# Patient Record
Sex: Male | Born: 1940 | Race: White | Hispanic: No | Marital: Married | Smoking: Never smoker
Health system: Southern US, Community
[De-identification: ages and names within clinical notes are randomized; demographics above are authoritative.]

## PROBLEM LIST (undated history)

## (undated) DIAGNOSIS — E785 Hyperlipidemia, unspecified: Secondary | ICD-10-CM

## (undated) DIAGNOSIS — R569 Unspecified convulsions: Secondary | ICD-10-CM

## (undated) DIAGNOSIS — W3400XA Accidental discharge from unspecified firearms or gun, initial encounter: Secondary | ICD-10-CM

## (undated) DIAGNOSIS — I10 Essential (primary) hypertension: Secondary | ICD-10-CM

## (undated) HISTORY — PX: COLONOSCOPY: SHX174

---

## 2006-02-06 HISTORY — PX: TOTAL HIP ARTHROPLASTY: SHX124

## 2015-02-07 HISTORY — PX: BACK SURGERY: SHX140

## 2017-05-03 ENCOUNTER — Emergency Department (HOSPITAL_COMMUNITY): Payer: Medicare Other

## 2017-05-03 ENCOUNTER — Emergency Department (HOSPITAL_COMMUNITY)
Admission: EM | Admit: 2017-05-03 | Discharge: 2017-05-03 | Disposition: A | Discharge status: Home / Self Care | Payer: Medicare Other | Attending: Emergency Medicine | Admitting: Emergency Medicine

## 2017-05-03 ENCOUNTER — Encounter (HOSPITAL_COMMUNITY): Payer: Self-pay | Admitting: Emergency Medicine

## 2017-05-03 DIAGNOSIS — Z7982 Long term (current) use of aspirin: Secondary | ICD-10-CM | POA: Diagnosis not present

## 2017-05-03 DIAGNOSIS — W182XXA Fall in (into) shower or empty bathtub, initial encounter: Secondary | ICD-10-CM | POA: Diagnosis not present

## 2017-05-03 DIAGNOSIS — Y9389 Activity, other specified: Secondary | ICD-10-CM | POA: Insufficient documentation

## 2017-05-03 DIAGNOSIS — Z79899 Other long term (current) drug therapy: Secondary | ICD-10-CM | POA: Diagnosis not present

## 2017-05-03 DIAGNOSIS — I10 Essential (primary) hypertension: Secondary | ICD-10-CM | POA: Diagnosis not present

## 2017-05-03 DIAGNOSIS — S4992XA Unspecified injury of left shoulder and upper arm, initial encounter: Secondary | ICD-10-CM | POA: Diagnosis present

## 2017-05-03 DIAGNOSIS — Y92002 Bathroom of unspecified non-institutional (private) residence single-family (private) house as the place of occurrence of the external cause: Secondary | ICD-10-CM | POA: Diagnosis not present

## 2017-05-03 DIAGNOSIS — Z96641 Presence of right artificial hip joint: Secondary | ICD-10-CM | POA: Insufficient documentation

## 2017-05-03 DIAGNOSIS — S42402A Unspecified fracture of lower end of left humerus, initial encounter for closed fracture: Secondary | ICD-10-CM | POA: Diagnosis not present

## 2017-05-03 DIAGNOSIS — Y999 Unspecified external cause status: Secondary | ICD-10-CM | POA: Insufficient documentation

## 2017-05-03 HISTORY — DX: Hyperlipidemia, unspecified: E78.5

## 2017-05-03 HISTORY — DX: Essential (primary) hypertension: I10

## 2017-05-03 HISTORY — DX: Unspecified convulsions: R56.9

## 2017-05-03 IMAGING — CR DG ELBOW COMPLETE 3+V*L*
4 series · 4 of 4 positions shown · non-contrast
Comparison: None.

CLINICAL DATA: Pain following fall

EXAM:
LEFT ELBOW - COMPLETE 3+ VIEW

[elbow ap]
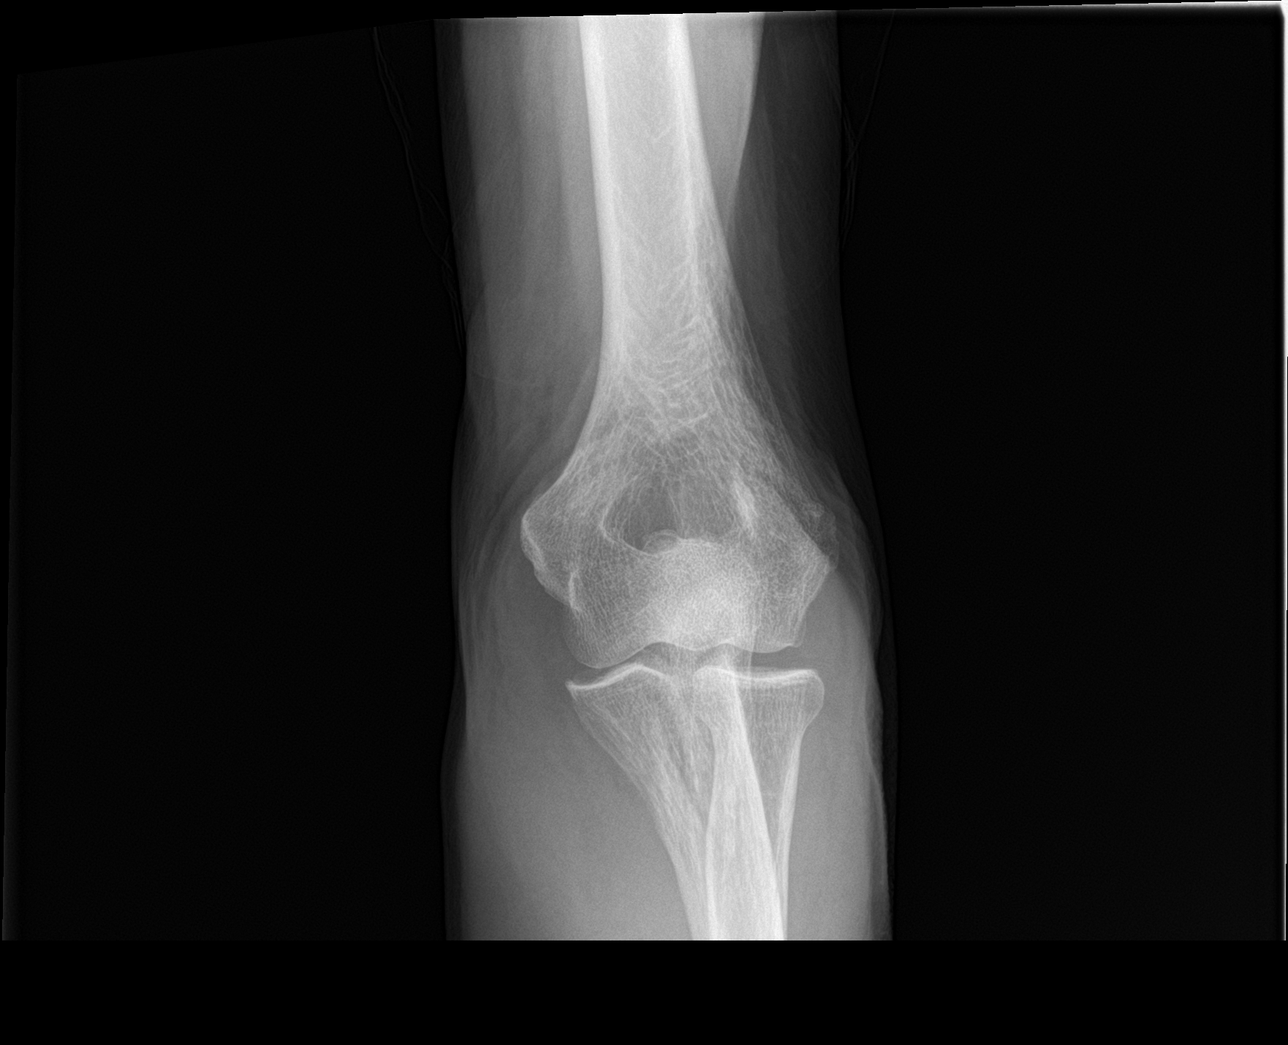

[elbow obl (1 of 2)]
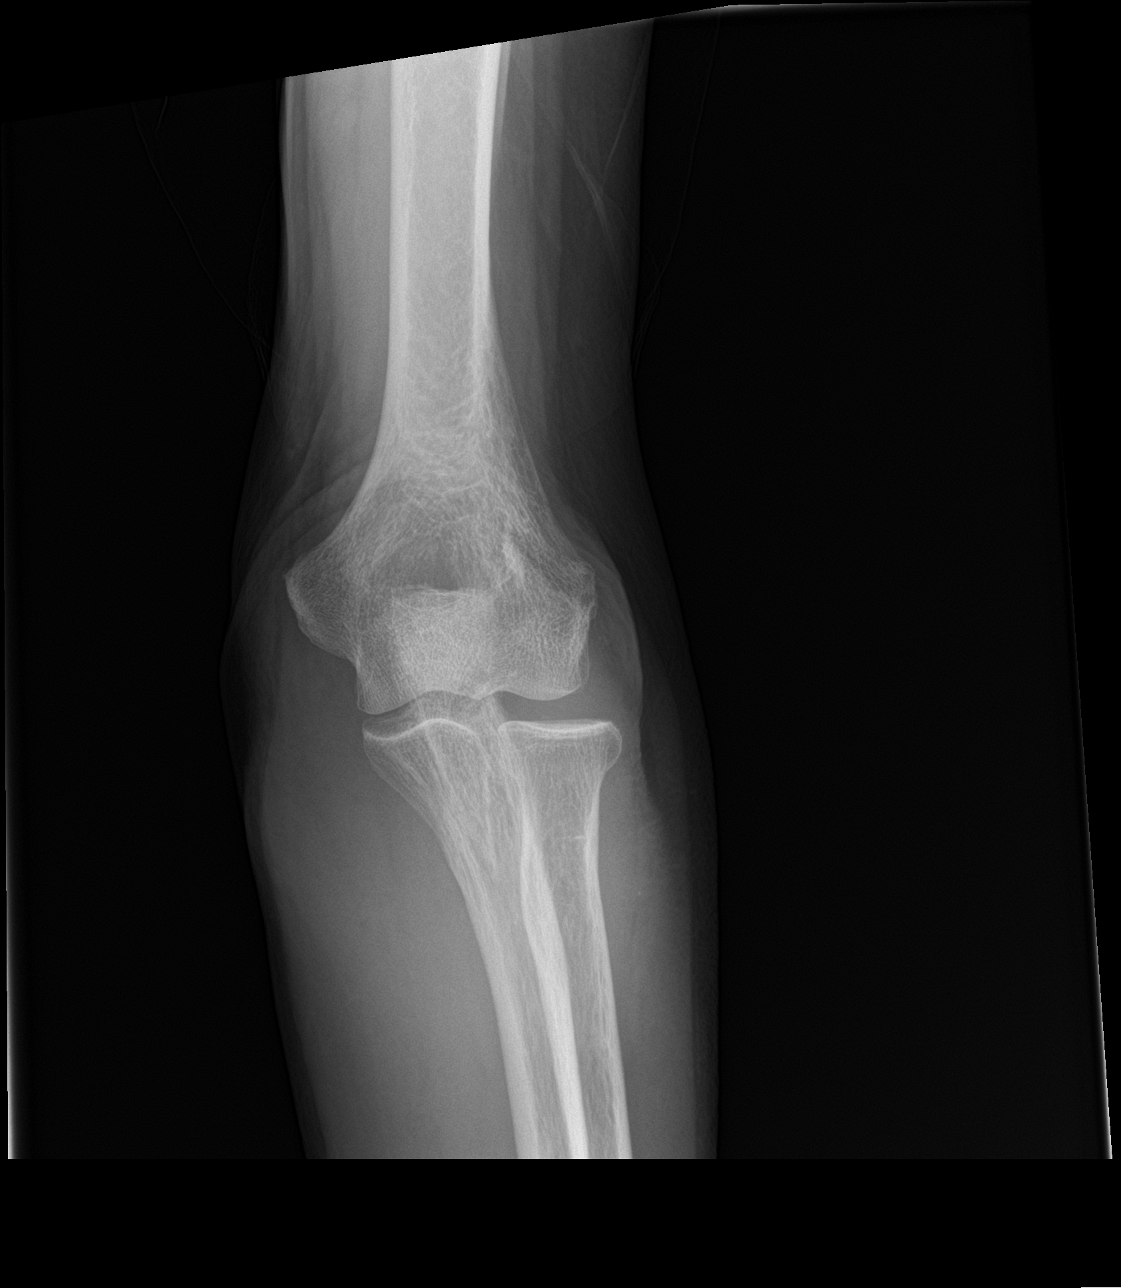

[elbow obl (2 of 2)]
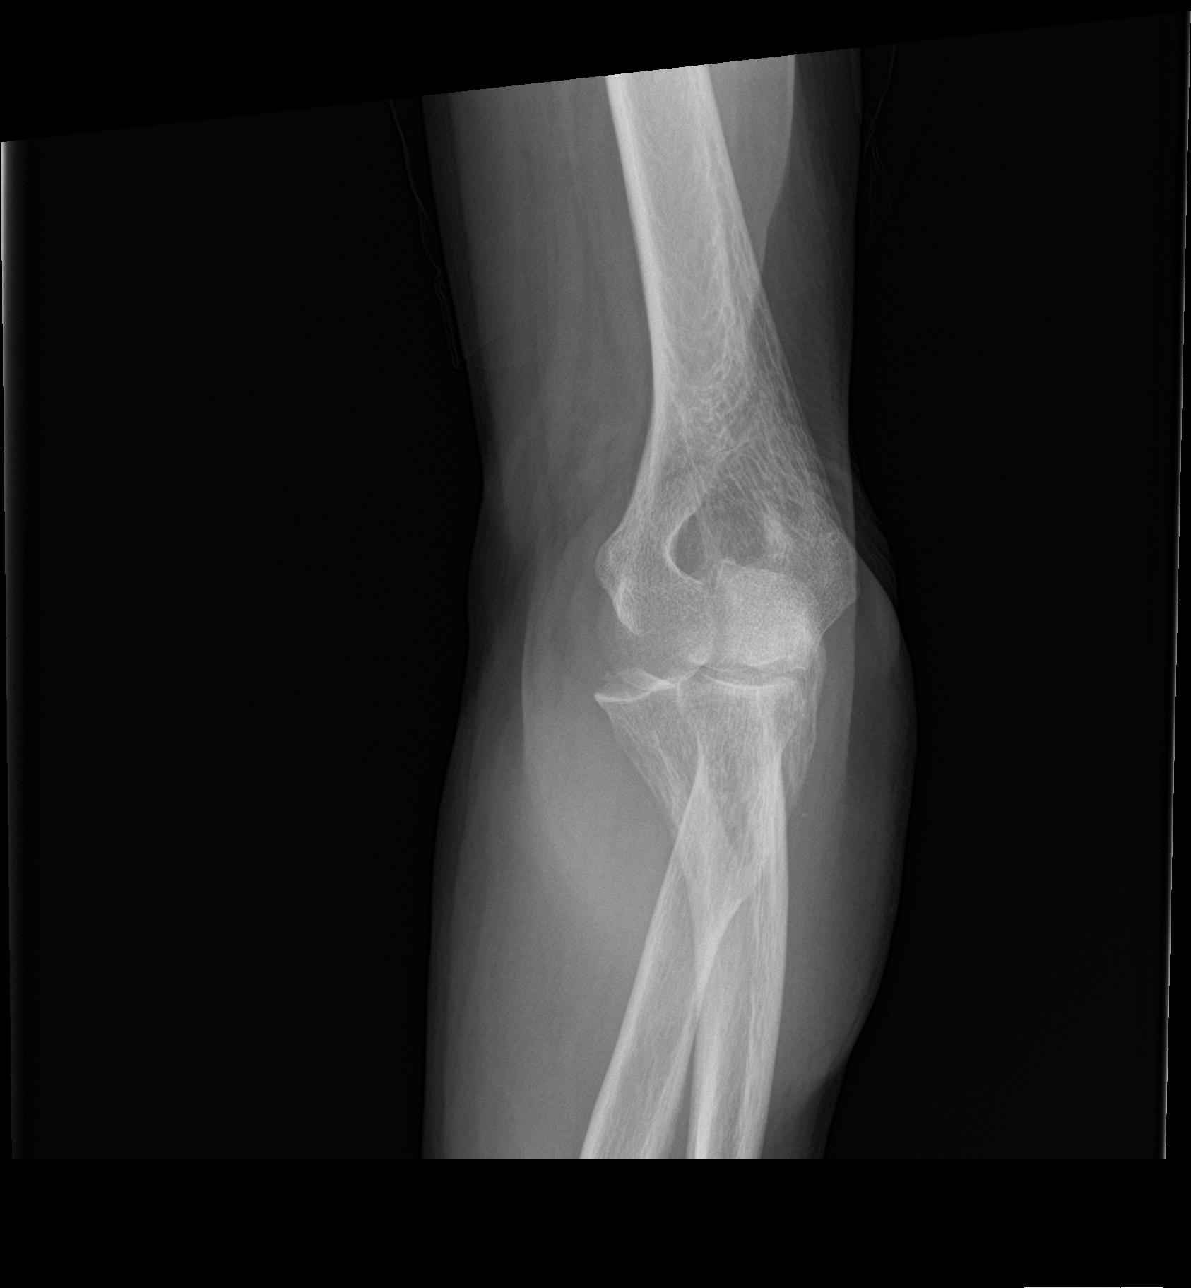

[elbow lat]
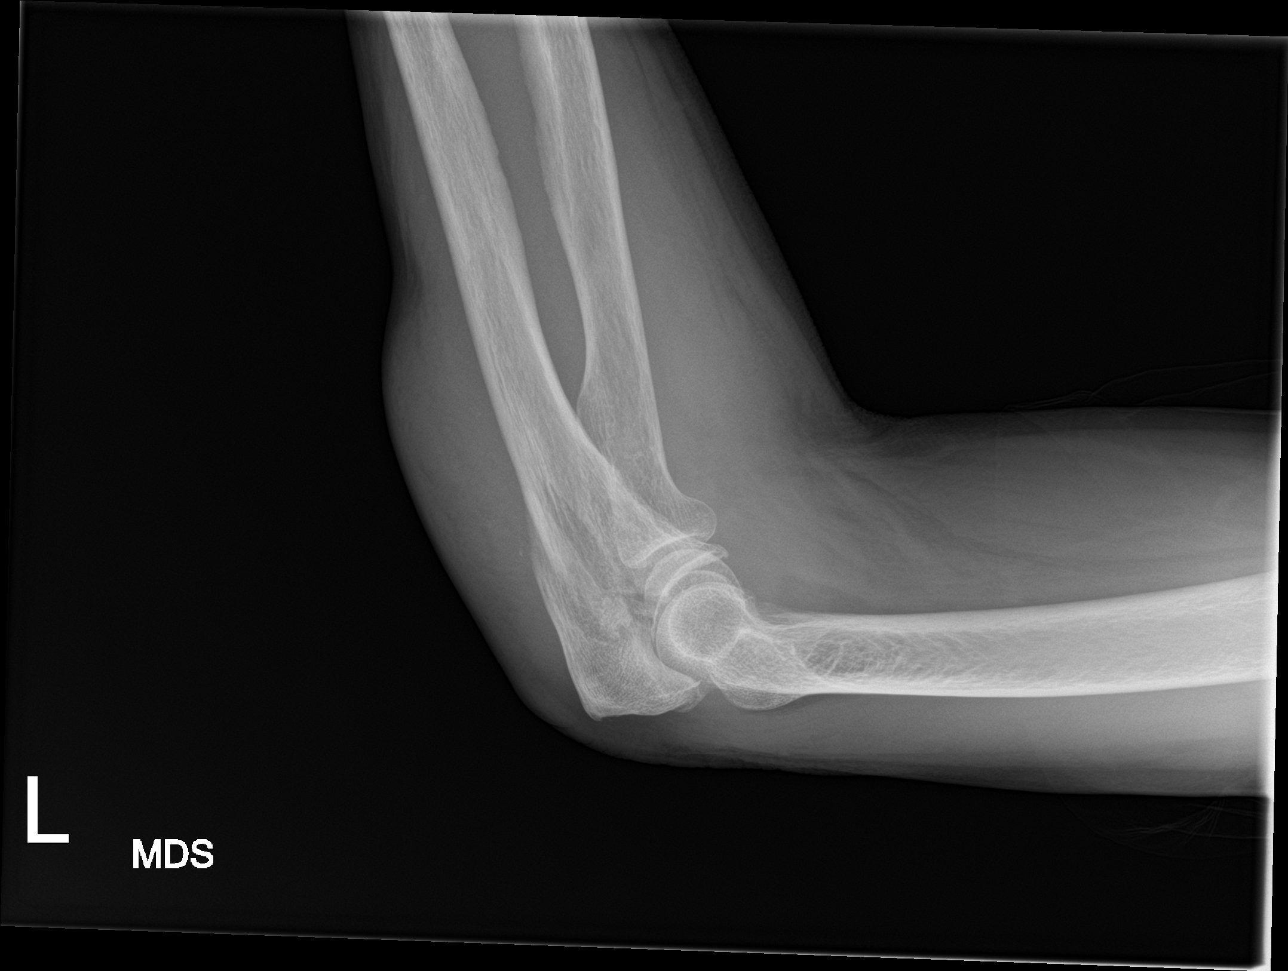

[4 of 4 positions shown; findings below may reference images not displayed]

FINDINGS: Frontal, lateral, and bilateral oblique views were obtained. There
is a comminuted fracture of the proximal ulna with marked soft
tissue swelling in this area. There is displacement of several
fracture fragments, best appreciated on the oblique views. There
fracture fragments extending into the anterior aspect of the elbow
joint. No dislocation. There is a joint effusion. There is no
significant joint space narrowing.
IMPRESSION: Comminuted fracture of the proximal ulna with marked soft tissue
swelling. Fracture fragments extend into the anterior aspect of the
right elbow joint. There is rotation of several fracture fragments
within this rather complex comminuted fracture region. No gross
dislocation. No underlying arthropathy.

## 2017-05-03 MED ORDER — HYDROCODONE-ACETAMINOPHEN 5-325 MG PO TABS: 1.0000 | ORAL_TABLET | Freq: Four times a day (QID) | ORAL | 0 refills | Status: DC | PRN

## 2017-05-03 NOTE — ED Provider Notes (Signed)
[facility] EMERGENCY DEPARTMENT Provider Note   CSN: [id number] Arrival date & time: 05/03/17  1610     History   Chief Complaint Chief Complaint  Patient presents with  . Broken Arm    HPI Aaron [name] is a 77 y.o. male.  The history is provided by the patient.  Arm Injury   This is a new problem. The current episode started 3 to 5 hours ago. The problem occurs constantly. The pain is present in the left elbow. The quality of the pain is described as aching. The pain is at a severity of 4/10. Associated symptoms include limited range of motion. Pertinent negatives include no numbness and no tingling. There has been a history of trauma.  Fall  This is a new problem. The current episode started 3 to 5 hours ago. Pertinent negatives include no chest pain, no abdominal pain, no headaches and no shortness of breath.  -Patient had a mechanical fall getting out of the shower and fell onto his left arm with sudden onset of pain.  Patient had x-rays done at urgent care that showed ulnar fracture near the elbow.  Patient is in here for further evaluation.  Past Medical History:  Diagnosis Date  . HLD (hyperlipidemia)   . Hypertension   . Seizures ([facility])     There are no active problems to display for this patient.   Past Surgical History:  Procedure Laterality Date  . TOTAL HIP ARTHROPLASTY Right 2008        Home Medications    Prior to Admission medications   Medication Sig Start Date End Date Taking? Authorizing Provider  Artificial Tear Ointment (DRY EYES OP) Place 1 drop into both eyes at bedtime.   Yes [provider]  aspirin EC 81 MG tablet Take 81 mg by mouth daily. 02/15/17 02/15/18 Yes [provider]  atorvastatin (LIPITOR) 80 MG tablet Take 80 mg by mouth daily. 02/15/17  Yes [provider]  latanoprost (XALATAN) 0.005 % ophthalmic solution Place 1 drop into both eyes at bedtime. 02/15/17  Yes [provider]  lisinopril (PRINIVIL,ZESTRIL) 10 MG tablet Take 10 mg by mouth daily. 02/15/17  Yes [provider]  pantoprazole (PROTONIX) 40 MG tablet Take 40 mg by mouth daily. 02/15/17  Yes [provider]  phenytoin (DILANTIN) 100 MG ER capsule Take 300 mg by mouth at bedtime.  02/15/17  Yes [provider]  HYDROcodone-acetaminophen (NORCO/VICODIN) 5-325 MG tablet Take 1 tablet by mouth every 6 (six) hours as needed. 05/03/17   Charlynne Pander, MD    Family History No family history on file.  Social History Social History   Tobacco Use  . Smoking status: Never Smoker  . Smokeless tobacco: Never Used  Substance Use Topics  . Alcohol use: Never    Frequency: Never  . Drug use: Never     Allergies   Patient has no known allergies.   Review of Systems Review of Systems  Constitutional: Negative for chills and fever.  HENT: Negative for ear pain and sore throat.   Eyes: Negative for pain and visual disturbance.  Respiratory: Negative for cough and shortness of breath.   Cardiovascular: Negative for chest pain and palpitations.  Gastrointestinal: Negative for abdominal pain, nausea and vomiting.  Genitourinary: Negative for dysuria and hematuria.  Musculoskeletal: Negative for back pain and neck pain.  Skin: Negative for rash and wound.  Neurological: Negative for tingling, weakness, numbness and headaches.  All other  systems reviewed and are negative.    Physical Exam Updated Vital Signs BP 108/76   Pulse 60   Temp 98 F (36.7 C) (Oral)   Resp 16   SpO2 96%   Physical Exam  Constitutional: He is oriented to person, place, and time. He appears well-developed and well-nourished.  HENT:  Head: Normocephalic and atraumatic.  Mouth/Throat: Oropharynx is clear and moist.  Eyes: Conjunctivae are normal.  Neck: Neck supple.  Cardiovascular: Normal rate and regular rhythm.  No murmur heard. Pulmonary/Chest: Effort normal and breath  sounds normal. No respiratory distress. He has no wheezes. He has no rales.  Abdominal: Soft. He exhibits no distension. There is no tenderness. There is no guarding.  Musculoskeletal: He exhibits no edema.       Left forearm: He exhibits tenderness, bony tenderness, swelling and deformity.  No spinal tenderness  Neurological: He is alert and oriented to person, place, and time.  Skin: Skin is warm and dry.  Psychiatric: He has a normal mood and affect.  Nursing note and vitals reviewed.    ED Treatments / Results  Labs (all labs ordered are listed, but only abnormal results are displayed) Labs Reviewed - No data to display  EKG None  Radiology Dg Elbow Complete Left  Result Date: 05/03/2017 CLINICAL DATA:  Pain following fall EXAM: LEFT ELBOW - COMPLETE 3+ VIEW COMPARISON:  None. FINDINGS: Frontal, lateral, and bilateral oblique views were obtained. There is a comminuted fracture of the proximal ulna with marked soft tissue swelling in this area. There is displacement of several fracture fragments, best appreciated on the oblique views. There fracture fragments extending into the anterior aspect of the elbow joint. No dislocation. There is a joint effusion. There is no significant joint space narrowing. IMPRESSION: Comminuted fracture of the proximal ulna with marked soft tissue swelling. Fracture fragments extend into the anterior aspect of the right elbow joint. There is rotation of several fracture fragments within this rather complex comminuted fracture region. No gross dislocation. No underlying arthropathy. Electronically Signed   By: Bretta BangWilliam  Woodruff III M.D.   On: 05/03/2017 17:09    Procedures Procedures (including critical care time)  Medications Ordered in ED Medications - No data to display   Initial Impression / Assessment and Plan / ED Course  I have reviewed the triage vital signs and the nursing notes.  Pertinent labs & imaging results that were available during  my care of the patient were reviewed by me and considered in my medical decision making (see chart for details).     Patient is a 77 year old male with history of hyperlipidemia, seizures, hypertension who presents with a proximal left ulnar fracture diagnosed at urgent care.  He has no other injuries reported or noted on exam.  Fall was mechanical fall.    X-rays showed a comminuted fracture of the proximal ulna with soft tissue swelling.  Fracture fragments extending to the anterior aspect of the elbow joint.  There is rotation of several fracture fragments in this complex comminuted fracture region.  No dislocation noted.  Orthopedics is consulted and they recommend splinting and follow-up in clinic.  Posterior slab splint applied.  Patient offered pain medicine but did not want any at this time.  Patient placed in a sling.  Prescription for Norco was given.  Patient to call for appointment for orthopedic clinic tomorrow.  Final Clinical Impressions(s) / ED Diagnoses   Final diagnoses:  Elbow fracture, left, closed, initial encounter    ED Discharge  Orders        Ordered    HYDROcodone-acetaminophen (NORCO/VICODIN) 5-325 MG tablet  Every 6 hours PRN     05/03/17 2132       Dwana Melena, DO 05/04/17 0004    Charlynne Pander, MD 05/05/17 [phone]

## 2017-05-03 NOTE — Progress Notes (Signed)
Orthopedic Tech Progress Note Patient Details:  Aaron [name] 04-16-1940 [id number]  Ortho Devices Type of Ortho Device: Ace wrap, Arm sling, Post (long arm) splint Ortho Device/Splint Location: LUE Ortho Device/Splint Interventions: Ordered, Application   Post Interventions Instructions Provided: Care of device   Aaron [name], Aaron [name] 05/03/2017, 8:41 PM

## 2017-05-03 NOTE — Discharge Instructions (Addendum)
Please call Dr. Glenna Durandrtmann's office tomorrow to be seen in the office.   Take motrin, tylenol for pain   Apply ice for swelling   Return to ER if you have worse elbow pain or swelling, numbness in fingers

## 2017-05-03 NOTE — ED Triage Notes (Signed)
Patient to ED following mechanical slip and fall in the shower today - [facility] and reports a broken arm (points to forearm below the elbow). Pain with movement, CSM intact.

## 2017-05-08 ENCOUNTER — Encounter (HOSPITAL_COMMUNITY): Payer: Self-pay | Admitting: *Deleted

## 2017-05-08 ENCOUNTER — Other Ambulatory Visit: Payer: Self-pay
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
## 2017-05-09 ENCOUNTER — Inpatient Hospital Stay (HOSPITAL_COMMUNITY)
Admission: RE | Admit: 2017-05-09 | Discharge: 2017-05-11 | DRG: 512 | Disposition: A | Discharge status: Skilled Nursing Facility | Payer: Medicare Other | Source: Ambulatory Visit | Attending: Orthopedic Surgery | Admitting: Orthopedic Surgery

## 2017-05-09 ENCOUNTER — Inpatient Hospital Stay (HOSPITAL_COMMUNITY): Payer: Medicare Other | Admitting: Certified Registered"

## 2017-05-09 ENCOUNTER — Encounter (HOSPITAL_COMMUNITY)
Admission: RE | Disposition: A | Discharge status: Skilled Nursing Facility | Payer: Self-pay | Source: Ambulatory Visit | Attending: Orthopedic Surgery

## 2017-05-09 ENCOUNTER — Encounter (HOSPITAL_COMMUNITY): Payer: Self-pay | Admitting: *Deleted

## 2017-05-09 ENCOUNTER — Other Ambulatory Visit: Payer: Self-pay

## 2017-05-09 DIAGNOSIS — I10 Essential (primary) hypertension: Secondary | ICD-10-CM | POA: Diagnosis present

## 2017-05-09 DIAGNOSIS — S52022A Displaced fracture of olecranon process without intraarticular extension of left ulna, initial encounter for closed fracture: Secondary | ICD-10-CM | POA: Diagnosis present

## 2017-05-09 DIAGNOSIS — M25522 Pain in left elbow: Secondary | ICD-10-CM | POA: Diagnosis present

## 2017-05-09 DIAGNOSIS — Y92002 Bathroom of unspecified non-institutional (private) residence single-family (private) house as the place of occurrence of the external cause: Secondary | ICD-10-CM | POA: Diagnosis not present

## 2017-05-09 DIAGNOSIS — W182XXA Fall in (into) shower or empty bathtub, initial encounter: Secondary | ICD-10-CM | POA: Diagnosis present

## 2017-05-09 DIAGNOSIS — Z7982 Long term (current) use of aspirin: Secondary | ICD-10-CM

## 2017-05-09 DIAGNOSIS — S52282A Bent bone of left ulna, initial encounter for closed fracture: Secondary | ICD-10-CM | POA: Diagnosis present

## 2017-05-09 DIAGNOSIS — E785 Hyperlipidemia, unspecified: Secondary | ICD-10-CM | POA: Diagnosis present

## 2017-05-09 DIAGNOSIS — Z96641 Presence of right artificial hip joint: Secondary | ICD-10-CM | POA: Diagnosis present

## 2017-05-09 HISTORY — DX: Accidental discharge from unspecified firearms or gun, initial encounter: W34.00XA

## 2017-05-09 HISTORY — PX: ORIF ELBOW FRACTURE: SHX5031

## 2017-05-09 HISTORY — PX: ORIF ULNAR FRACTURE: SHX5417

## 2017-05-09 LAB — CBC
HEMATOCRIT: 35 % — AB (ref 39.0–52.0)
HEMOGLOBIN: 11.2 g/dL — AB (ref 13.0–17.0)
MCH: 28.2 pg (ref 26.0–34.0)
MCHC: 32 g/dL (ref 30.0–36.0)
MCV: 88.2 fL (ref 78.0–100.0)
PLATELETS: 198 10*3/uL (ref 150–400)
RBC: 3.97 MIL/uL — AB (ref 4.22–5.81)
RDW: 14.8 % (ref 11.5–15.5)
WBC: 4.3 10*3/uL (ref 4.0–10.5)

## 2017-05-09 LAB — BASIC METABOLIC PANEL
Anion gap: 11 (ref 5–15)
BUN: 16 mg/dL (ref 6–20)
CHLORIDE: 98 mmol/L — AB (ref 101–111)
CO2: 25 mmol/L (ref 22–32)
Calcium: 8.3 mg/dL — ABNORMAL LOW (ref 8.9–10.3)
Creatinine, Ser: 0.71 mg/dL (ref 0.61–1.24)
GFR calc non Af Amer: >60 mL/min (ref >60)
Glucose, Bld: 114 mg/dL — ABNORMAL HIGH (ref 65–99)
POTASSIUM: 4 mmol/L (ref 3.5–5.1)
Sodium: 134 mmol/L — ABNORMAL LOW (ref 135–145)

## 2017-05-09 SURGERY — OPEN REDUCTION INTERNAL FIXATION (ORIF) ELBOW/OLECRANON FRACTURE
Anesthesia: General | Laterality: Left

## 2017-05-09 MED ORDER — HYDROMORPHONE HCL 1 MG/ML IJ SOLN
INTRAMUSCULAR | Status: AC
  Administered 2017-05-09: 0.5 mg via INTRAVENOUS
  Filled 2017-05-09: qty 1

## 2017-05-09 MED ORDER — BUPIVACAINE HCL (PF) 0.25 % IJ SOLN
INTRAMUSCULAR | Status: DC | PRN
  Administered 2017-05-09: 7 mL

## 2017-05-09 MED ORDER — MIDAZOLAM HCL 2 MG/2ML IJ SOLN
INTRAMUSCULAR | Status: AC
  Filled 2017-05-09: qty 2

## 2017-05-09 MED ORDER — HYDROCODONE-ACETAMINOPHEN 5-325 MG PO TABS: 1.0000 | ORAL_TABLET | ORAL | Status: DC | PRN

## 2017-05-09 MED ORDER — HYDROMORPHONE HCL 1 MG/ML IJ SOLN
0.2500 mg | INTRAMUSCULAR | Status: DC | PRN
Start: 2017-05-09 — End: 2017-05-09
  Administered 2017-05-09 (×2): 0.5 mg via INTRAVENOUS

## 2017-05-09 MED ORDER — ATORVASTATIN CALCIUM 80 MG PO TABS
80.0000 mg | ORAL_TABLET | Freq: Every day | ORAL | Status: DC
  Administered 2017-05-10: 80 mg via ORAL
  Filled 2017-05-09: qty 1

## 2017-05-09 MED ORDER — OXYCODONE-ACETAMINOPHEN 5-325 MG PO TABS
1.0000 | ORAL_TABLET | ORAL | Status: DC | PRN
  Administered 2017-05-10: 2 via ORAL
  Administered 2017-05-10: 1 via ORAL
  Administered 2017-05-11: 2 via ORAL
  Filled 2017-05-09: qty 2
  Filled 2017-05-09: qty 1
  Filled 2017-05-09 (×2): qty 2

## 2017-05-09 MED ORDER — LATANOPROST 0.005 % OP SOLN
1.0000 [drp] | Freq: Every day | OPHTHALMIC | Status: DC
  Administered 2017-05-09 – 2017-05-10 (×2): 1 [drp] via OPHTHALMIC
  Filled 2017-05-09: qty 2.5

## 2017-05-09 MED ORDER — HYDROMORPHONE HCL 1 MG/ML IJ SOLN: 0.5000 mg | INTRAMUSCULAR | Status: DC | PRN

## 2017-05-09 MED ORDER — DIPHENHYDRAMINE HCL 25 MG PO CAPS: 25.0000 mg | ORAL_CAPSULE | Freq: Four times a day (QID) | ORAL | Status: DC | PRN

## 2017-05-09 MED ORDER — PROPOFOL 10 MG/ML IV BOLUS
INTRAVENOUS | Status: AC
  Filled 2017-05-09: qty 20

## 2017-05-09 MED ORDER — ONDANSETRON HCL 4 MG/2ML IJ SOLN: 4.0000 mg | Freq: Once | INTRAMUSCULAR | Status: DC | PRN

## 2017-05-09 MED ORDER — ONDANSETRON HCL 4 MG PO TABS: 4.0000 mg | ORAL_TABLET | Freq: Four times a day (QID) | ORAL | Status: DC | PRN

## 2017-05-09 MED ORDER — PANTOPRAZOLE SODIUM 40 MG PO TBEC
40.0000 mg | DELAYED_RELEASE_TABLET | Freq: Every day | ORAL | Status: DC
  Administered 2017-05-10 – 2017-05-11 (×2): 40 mg via ORAL
  Filled 2017-05-09 (×2): qty 1

## 2017-05-09 MED ORDER — METHOCARBAMOL 1000 MG/10ML IJ SOLN
500.0000 mg | Freq: Four times a day (QID) | INTRAVENOUS | Status: DC | PRN
  Filled 2017-05-09: qty 5

## 2017-05-09 MED ORDER — CEFAZOLIN SODIUM-DEXTROSE 2-3 GM-%(50ML) IV SOLR
INTRAVENOUS | Status: DC | PRN
  Administered 2017-05-09: 2 g via INTRAVENOUS

## 2017-05-09 MED ORDER — ASPIRIN EC 81 MG PO TBEC
81.0000 mg | DELAYED_RELEASE_TABLET | Freq: Every day | ORAL | Status: DC
  Administered 2017-05-10 – 2017-05-11 (×2): 81 mg via ORAL
  Filled 2017-05-09 (×2): qty 1

## 2017-05-09 MED ORDER — 0.9 % SODIUM CHLORIDE (POUR BTL) OPTIME
TOPICAL | Status: DC | PRN
  Administered 2017-05-09: 1000 mL

## 2017-05-09 MED ORDER — VITAMIN C 500 MG PO TABS
1000.0000 mg | ORAL_TABLET | Freq: Every day | ORAL | Status: DC
  Administered 2017-05-10 – 2017-05-11 (×2): 1000 mg via ORAL
  Filled 2017-05-09 (×2): qty 2

## 2017-05-09 MED ORDER — KCL IN DEXTROSE-NACL 20-5-0.45 MEQ/L-%-% IV SOLN
INTRAVENOUS | Status: DC
  Administered 2017-05-09: 21:00:00 via INTRAVENOUS
  Filled 2017-05-09: qty 1000

## 2017-05-09 MED ORDER — CEFAZOLIN SODIUM-DEXTROSE 1-4 GM/50ML-% IV SOLN
INTRAVENOUS | Status: AC
  Administered 2017-05-09: 1 g via INTRAVENOUS
  Filled 2017-05-09: qty 50

## 2017-05-09 MED ORDER — PROPOFOL 10 MG/ML IV BOLUS
INTRAVENOUS | Status: DC | PRN
  Administered 2017-05-09: 200 mg via INTRAVENOUS
  Administered 2017-05-09: 50 mg via INTRAVENOUS

## 2017-05-09 MED ORDER — BUPIVACAINE HCL (PF) 0.25 % IJ SOLN
INTRAMUSCULAR | Status: AC
  Filled 2017-05-09: qty 30

## 2017-05-09 MED ORDER — LACTATED RINGERS IV SOLN
INTRAVENOUS | Status: DC
  Administered 2017-05-09 (×3): via INTRAVENOUS

## 2017-05-09 MED ORDER — CEFAZOLIN SODIUM-DEXTROSE 1-4 GM/50ML-% IV SOLN
1.0000 g | INTRAVENOUS | Status: AC
  Administered 2017-05-09: 1 g via INTRAVENOUS

## 2017-05-09 MED ORDER — ONDANSETRON HCL 4 MG/2ML IJ SOLN: 4.0000 mg | Freq: Four times a day (QID) | INTRAMUSCULAR | Status: DC | PRN

## 2017-05-09 MED ORDER — DEXAMETHASONE SODIUM PHOSPHATE 4 MG/ML IJ SOLN
INTRAMUSCULAR | Status: DC | PRN
  Administered 2017-05-09: 8 mg via INTRAVENOUS

## 2017-05-09 MED ORDER — MEPERIDINE HCL 50 MG/ML IJ SOLN: 6.2500 mg | INTRAMUSCULAR | Status: DC | PRN

## 2017-05-09 MED ORDER — LIDOCAINE HCL (CARDIAC) 20 MG/ML IV SOLN
INTRAVENOUS | Status: DC | PRN
  Administered 2017-05-09: 60 mg via INTRAVENOUS

## 2017-05-09 MED ORDER — FENTANYL CITRATE (PF) 250 MCG/5ML IJ SOLN
INTRAMUSCULAR | Status: AC
  Filled 2017-05-09: qty 5

## 2017-05-09 MED ORDER — LISINOPRIL 10 MG PO TABS
10.0000 mg | ORAL_TABLET | Freq: Every day | ORAL | Status: DC
  Administered 2017-05-10: 10 mg via ORAL
  Filled 2017-05-09 (×2): qty 1

## 2017-05-09 MED ORDER — PHENYTOIN SODIUM EXTENDED 100 MG PO CAPS
300.0000 mg | ORAL_CAPSULE | Freq: Every day | ORAL | Status: DC
  Administered 2017-05-09 – 2017-05-11 (×3): 300 mg via ORAL
  Filled 2017-05-09 (×3): qty 3

## 2017-05-09 MED ORDER — METHOCARBAMOL 500 MG PO TABS
500.0000 mg | ORAL_TABLET | Freq: Four times a day (QID) | ORAL | Status: DC | PRN
  Filled 2017-05-09: qty 1

## 2017-05-09 MED ORDER — FENTANYL CITRATE (PF) 100 MCG/2ML IJ SOLN
INTRAMUSCULAR | Status: DC | PRN
  Administered 2017-05-09 (×2): 50 ug via INTRAVENOUS

## 2017-05-09 MED ORDER — CEFAZOLIN SODIUM-DEXTROSE 2-4 GM/100ML-% IV SOLN
INTRAVENOUS | Status: AC
  Filled 2017-05-09: qty 100

## 2017-05-09 MED ORDER — CEFAZOLIN SODIUM-DEXTROSE 1-4 GM/50ML-% IV SOLN
1.0000 g | Freq: Three times a day (TID) | INTRAVENOUS | Status: DC
  Administered 2017-05-10 – 2017-05-11 (×4): 1 g via INTRAVENOUS
  Filled 2017-05-09 (×7): qty 50

## 2017-05-09 MED ORDER — ONDANSETRON HCL 4 MG/2ML IJ SOLN
INTRAMUSCULAR | Status: DC | PRN
  Administered 2017-05-09: 4 mg via INTRAVENOUS

## 2017-05-09 SURGICAL SUPPLY — 61 items
BANDAGE ACE 3X5.8 VEL STRL LF (GAUZE/BANDAGES/DRESSINGS) ×3 IMPLANT
BANDAGE ACE 4X5 VEL STRL LF (GAUZE/BANDAGES/DRESSINGS) ×3 IMPLANT
BIT DRILL 2.5X2.75 QC CALB (BIT) ×3 IMPLANT
BIT DRILL CALIBRATED 2.7 (BIT) ×2 IMPLANT
BIT DRILL CALIBRATED 2.7MM (BIT) ×1
BNDG COHESIVE 4X5 TAN STRL (GAUZE/BANDAGES/DRESSINGS) ×3 IMPLANT
BNDG ESMARK 4X9 LF (GAUZE/BANDAGES/DRESSINGS) ×3 IMPLANT
BNDG GAUZE ELAST 4 BULKY (GAUZE/BANDAGES/DRESSINGS) ×3 IMPLANT
CORDS BIPOLAR (ELECTRODE) ×3 IMPLANT
COVER MAYO STAND STRL (DRAPES) ×3 IMPLANT
COVER SURGICAL LIGHT HANDLE (MISCELLANEOUS) ×3 IMPLANT
CUFF TOURNIQUET SINGLE 18IN (TOURNIQUET CUFF) ×3 IMPLANT
DRAPE INCISE IOBAN 66X45 STRL (DRAPES) ×3 IMPLANT
DRAPE OEC MINIVIEW 54X84 (DRAPES) ×3 IMPLANT
DRAPE ORTHO SPLIT 77X108 STRL (DRAPES) ×4
DRAPE SURG ORHT 6 SPLT 77X108 (DRAPES) ×2 IMPLANT
DRAPE U-SHAPE 47X51 STRL (DRAPES) ×3 IMPLANT
DRSG ADAPTIC 3X8 NADH LF (GAUZE/BANDAGES/DRESSINGS) IMPLANT
GAUZE SPONGE 4X4 12PLY STRL (GAUZE/BANDAGES/DRESSINGS) ×3 IMPLANT
GAUZE XEROFORM 5X9 LF (GAUZE/BANDAGES/DRESSINGS) ×3 IMPLANT
GLOVE BIOGEL PI IND STRL 8.5 (GLOVE) ×1 IMPLANT
GLOVE BIOGEL PI INDICATOR 8.5 (GLOVE) ×2
GLOVE SURG ORTHO 8.0 STRL STRW (GLOVE) ×3 IMPLANT
GOWN STRL REUS W/ TWL LRG LVL3 (GOWN DISPOSABLE) ×3 IMPLANT
GOWN STRL REUS W/ TWL XL LVL3 (GOWN DISPOSABLE) ×1 IMPLANT
GOWN STRL REUS W/TWL LRG LVL3 (GOWN DISPOSABLE) ×6
GOWN STRL REUS W/TWL XL LVL3 (GOWN DISPOSABLE) ×2
K-WIRE FIXATION 2.0X6 (WIRE) ×6
KIT BASIN OR (CUSTOM PROCEDURE TRAY) ×3 IMPLANT
KIT TURNOVER KIT B (KITS) ×3 IMPLANT
KWIRE FIXATION 2.0X6 (WIRE) ×2 IMPLANT
LOOP VESSEL MAXI BLUE (MISCELLANEOUS) IMPLANT
MANIFOLD NEPTUNE II (INSTRUMENTS) ×3 IMPLANT
NEEDLE HYPO 25GX1X1/2 BEV (NEEDLE) ×3 IMPLANT
NS IRRIG 1000ML POUR BTL (IV SOLUTION) ×3 IMPLANT
PACK ORTHO EXTREMITY (CUSTOM PROCEDURE TRAY) ×3 IMPLANT
PAD ARMBOARD 7.5X6 YLW CONV (MISCELLANEOUS) ×6 IMPLANT
PAD CAST 4YDX4 CTTN HI CHSV (CAST SUPPLIES) ×1 IMPLANT
PADDING CAST COTTON 4X4 STRL (CAST SUPPLIES) ×2
PLATE OLECRANON LRG (Plate) ×3 IMPLANT
SCREW CORTICAL LOW PROF 3.5X20 (Screw) ×9 IMPLANT
SCREW LOCK CORT STAR 3.5X10 (Screw) ×6 IMPLANT
SCREW LOCK CORT STAR 3.5X22 (Screw) ×6 IMPLANT
SCREW LOW PROFILE 22MMX3.5MM (Screw) ×3 IMPLANT
SCREW LP 3.5 (Screw) ×3 IMPLANT
SOAP 2 % CHG 4 OZ (WOUND CARE) ×3 IMPLANT
SUCTION FRAZIER HANDLE 10FR (MISCELLANEOUS) ×2
SUCTION TUBE FRAZIER 10FR DISP (MISCELLANEOUS) ×1 IMPLANT
SUT PROLENE 3 0 PS 2 (SUTURE) ×3 IMPLANT
SUT PROLENE 4 0 PS 2 18 (SUTURE) IMPLANT
SUT VIC AB 2-0 CT1 27 (SUTURE) ×2
SUT VIC AB 2-0 CT1 TAPERPNT 27 (SUTURE) ×1 IMPLANT
SUT VICRYL 4-0 PS2 18IN ABS (SUTURE) ×3 IMPLANT
SYR CONTROL 10ML LL (SYRINGE) IMPLANT
TOWEL OR 17X24 6PK STRL BLUE (TOWEL DISPOSABLE) ×3 IMPLANT
TOWEL OR 17X26 10 PK STRL BLUE (TOWEL DISPOSABLE) ×6 IMPLANT
TUBE CONNECTING 12'X1/4 (SUCTIONS)
TUBE CONNECTING 12X1/4 (SUCTIONS) IMPLANT
UNDERPAD 30X30 (UNDERPADS AND DIAPERS) ×3 IMPLANT
WASHER 3.5MM (Orthopedic Implant) ×3 IMPLANT
WATER STERILE IRR 1000ML POUR (IV SOLUTION) ×3 IMPLANT
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
## 2017-05-09 NOTE — Anesthesia Postprocedure Evaluation (Signed)
Anesthesia Post Note  Patient: Aaron [name]  Procedure(s) Performed: OPEN REDUCTION INTERNAL FIXATION (ORIF) ELBOW/OLECRANON FRACTURE (Left )     Patient location during evaluation: PACU Anesthesia Type: General Level of consciousness: awake and alert Pain management: pain level controlled Vital Signs Assessment: post-procedure vital signs reviewed and stable Respiratory status: spontaneous breathing, nonlabored ventilation, respiratory function stable and patient connected to nasal cannula oxygen Cardiovascular status: blood pressure returned to baseline and stable Postop Assessment: no apparent nausea or vomiting Anesthetic complications: no    Last Vitals:  Vitals:   05/09/17 1915 05/09/17 1930  BP: 138/66 (!) 144/76  Pulse: 74 72  Resp: 18 16  Temp:    SpO2: 97% 97%    Last Pain:  Vitals:   05/09/17 1910  TempSrc:   PainSc: 0-No pain                 Shelton SilvasKevin D Maleta Pacha

## 2017-05-09 NOTE — Transfer of Care (Signed)
Immediate Anesthesia Transfer of Care Note  Patient: Meda CoffeeCharles Freeman Ketchem  Procedure(s) Performed: OPEN REDUCTION INTERNAL FIXATION (ORIF) ELBOW/OLECRANON FRACTURE (Left )  Patient Location: PACU  Anesthesia Type:General  Level of Consciousness: awake, oriented, drowsy and patient cooperative  Airway & Oxygen Therapy: Patient Spontanous Breathing  Post-op Assessment: Report given to RN and Post -op Vital signs reviewed and stable  Post vital signs: Reviewed and stable  Last Vitals:  Vitals Value Taken Time  BP 143/76 05/09/2017  6:15 PM  Temp    Pulse 80 05/09/2017  6:29 PM  Resp 21 05/09/2017  6:29 PM  SpO2 92 % 05/09/2017  6:29 PM  Vitals shown include unvalidated device data.  Last Pain:  Vitals:   05/09/17 1813  TempSrc:   PainSc: 0-No pain      Patients Stated Pain Goal: 2 (05/09/17 1423)  Complications: No apparent anesthesia complications

## 2017-05-09 NOTE — Anesthesia Preprocedure Evaluation (Signed)
Anesthesia Evaluation  Patient identified by MRN, date of birth, ID band Patient awake    Reviewed: Allergy & Precautions, NPO status , Patient's Chart, lab work & pertinent test results  Airway Mallampati: I  TM Distance: >3 FB Neck ROM: Full    Dental   Pulmonary    Pulmonary exam normal        Cardiovascular hypertension, Pt. on medications Normal cardiovascular exam     Neuro/Psych    GI/Hepatic   Endo/Other    Renal/GU      Musculoskeletal   Abdominal   Peds  Hematology   Anesthesia Other Findings   Reproductive/Obstetrics                             Anesthesia Physical Anesthesia Plan  ASA: II  Anesthesia Plan: General   Post-op Pain Management:  Regional for Post-op pain   Induction: Intravenous  PONV Risk Score and Plan: 1 and Ondansetron  Airway Management Planned: LMA  Additional Equipment:   Intra-op Plan:   Post-operative Plan: Extubation in OR  Informed Consent: I have reviewed the patients History and Physical, chart, labs and discussed the procedure including the risks, benefits and alternatives for the proposed anesthesia with the patient or authorized representative who has indicated his/her understanding and acceptance.     Plan Discussed with: CRNA and Surgeon  Anesthesia Plan Comments:         Anesthesia Quick Evaluation

## 2017-05-09 NOTE — Anesthesia Procedure Notes (Signed)
Procedure Name: LMA Insertion Date/Time: 05/09/2017 4:46 PM Performed by: Tillman AbideHawkins, Chasitty Hehl B, CRNA Pre-anesthesia Checklist: Patient identified, Emergency Drugs available, Suction available and Patient being monitored Patient Re-evaluated:Patient Re-evaluated prior to induction Oxygen Delivery Method: Circle System Utilized Preoxygenation: Pre-oxygenation with 100% oxygen Induction Type: IV induction LMA: LMA inserted LMA Size: 4.0 Number of attempts: 1 Placement Confirmation: positive ETCO2 and breath sounds checked- equal and bilateral Tube secured with: Tape Dental Injury: Teeth and Oropharynx as per pre-operative assessment

## 2017-05-09 NOTE — Op Note (Signed)
PREOPERATIVE DIAGNOSIS: Left displaced proximal olecranon fracture  POSTOPERATIVE DIAGNOSIS: Same  ATTENDING SURGEON: Dr. Bradly BienenstockFred Shawndell Schillaci who was scrubbed and present for the entire procedure  ASSISTANT SURGEON: Lambert ModySamantha Barton PA-C who was scrubbed and necessary for open reduction internal fixation closure and splinting in a timely fashion  ANESTHESIA: Gen. via laryngeal mask airway  OPERATIVE PROCEDURE: #1: Open treatment of left proximal olecranon fracture with internal fixation #2: Radiographs 3 views left elbow  IMPLANTS: Biomet proximal olecranon plate with combination of locking and nonlocking screws  RADIOGRAPHIC INTERPRETATION: AP lateral oblique views the elbow do show the internal fixation place with good restoration of the ulnohumeral joint radiocapitellar joint  SURGICAL INDICATIONS: [name] is a right-hand-dominant gentleman who sustained a closed injury to his left elbow. Patient seen and evaluated in the office and recommended undergo the above procedure. Risks benefits and alternatives were discussed in detail with the patient in a signed informed consent was obtained on the day of surgery. Risks include but not limited to bleeding infection damage to nearby nerves arteries or tendons nonunion malunion hardware failure loss of motion of the wrists and digits and need for further surgical intervention  SURGICAL TECHNIQUE: Patient is properly identified in the preoperative holding area and a mark with a permanent marker made on the left elbow to indicate the correct operative site. The patient is then brought back to operating room placed supine on anesthesia and table where general anesthesia was administered. Patient tolerated this well. Preoperative antibiotics were given prior to any skin incision. A well-padded tourniquet was then placed on the left brachium and sealed with the appropriate drape. The left arM  was then prepped and draped in normal sterile fashion. A timeout  was called the correct site was identified and the procedure then begun. A curvilinear incision was made directly over the olecranon tip curving radially. Dissection carried down through the skin and subcutaneous tissues after the tourniquet insufflated. Large flaps were then raised and the periosteal fascial layer was incised directly over the bone. The fracture site was then exposed. Takedown of the fracture hematoma was then carried out. Open reduction was then performed and held in place with reduction clamps. Following this the plate was then applied to the posterior surface of the bone. It was held in place with K wires proximally and distally and plate position was then confirmed using the mini C-arm. After adequate plate position the 2 locking screws were then placed proximally with a 2.7 mm drill bit. Distal fixation was then achieved with a 2.5 mm drill bit and 35 nonlocking screws. A total of 8 cortices distally. Locking screws were then placed proximally. The oblong our proximal hole was then placed with a non-locking bicortical screw engaging the anterior cortex. The wound was then thoroughly irrigated. After thorough wound irrigation reduction clamps were then removed. Final images were then obtained which showed good anatomical reduction. The fascial layer was closed with 2-0 Vicryl. Subcutaneous tissues closed with 4-0 Vicryl and the skin closed a running 3-0 Prolene. Xeroform dressing and a sterile compressive bandage then applied. The patient was then placed in a well-padded posterior splint and extubated taken recovery room in good condition.  POSTOPERATIVE PLAN: Patient is be admitted for physical therapy and occupational therapy and possibly home health versus assisted living given his other comorbidities and his lack of use of the right upper extremity. I'll see him back in the office in 2 weeks Continue with the current splint for the next 2 weeks  X-rays at next visit Down to see her  therapist for a long-arm splint See him at the four-week mark 8 week mark radiographs at each visit.

## 2017-05-10 ENCOUNTER — Other Ambulatory Visit: Payer: Self-pay

## 2017-05-10 ENCOUNTER — Encounter (HOSPITAL_COMMUNITY): Payer: Self-pay | Admitting: General Practice

## 2017-05-10 MED ORDER — ACETAMINOPHEN 325 MG PO TABS: 650.0000 mg | ORAL_TABLET | Freq: Four times a day (QID) | ORAL | Status: DC | PRN

## 2017-05-10 MED ORDER — TRAMADOL HCL 50 MG PO TABS: 50.0000 mg | ORAL_TABLET | Freq: Four times a day (QID) | ORAL | Status: DC | PRN
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
## 2017-05-10 NOTE — Clinical Social Work Note (Signed)
Clinical Social Work Assessment  Patient Details  Name: Aaron [name] MRN: [id number] Date of Birth: January 06, 1941  Date of referral:  05/10/17               Reason for consult:  Facility Placement                Permission sought to share information with:  Chartered certified accountant granted to share information::  Yes, Verbal Permission Granted  Name::     Aaron [name]  Agency::  SNF  Relationship::  son  Contact Information:     Housing/Transportation Living arrangements for the past 2 months:  Single Family Home Source of Information:  Patient Patient Interpreter Needed:  None Criminal Activity/Legal Involvement Pertinent to Current Situation/Hospitalization:  No - Comment as needed Significant Relationships:  Adult Children, Other Family Members Lives with:  Adult Children, Self Do you feel safe going back to the place where you live?  No Need for family participation in patient care:  Yes (Comment)  Care giving concerns:  Pt with new impairment and may need rehab. Pt open to rehab or going home depending on what PT recommends.  Social Worker assessment / plan:  CSW met with patient at bedside to discuss disposition. CSW explained SNF process/placement and disposition. CSW will await for therapy evaluations to determine what the next steps are. Pt confirmed that he resides with son and was independent prior to fall.  Employment status:  Retired Nurse, adult PT Recommendations:  [place] / Referral to community resources:  [facility]  Patient/Family's Response to care:  Patient thanked CSW for meeting to discuss the disposition. CSW has no issues or concerns.  Patient/Family's Understanding of and Emotional Response to Diagnosis, Current Treatment, and Prognosis:  Patient has good understanding of diagnosis and impairment and is open to short term rehab or home depending on the  recommendations by therapy. Pt desires to return to baseline and improve. CSW will continue to follow for disposition. No issues or concerns identified at this time.  Emotional Assessment Appearance:  Appears stated age Attitude/Demeanor/Rapport:  (Cooperative) Affect (typically observed):  Accepting, Appropriate Orientation:  Oriented to Situation, Oriented to  Time, Oriented to Place, Oriented to Self Alcohol / Substance use:  Not Applicable Psych involvement (Current and /or in the community):  No (Comment)  Discharge Needs  Concerns to be addressed:  Discharge Planning Concerns Readmission within the last 30 days:  No Current discharge risk:  Dependent with Mobility, Physical Impairment Barriers to Discharge:  No Barriers Identified   Aaron Baxter, LCSW 05/10/2017, 12:02 PM

## 2017-05-10 NOTE — Progress Notes (Signed)
Pt requested all four siderails up 

## 2017-05-10 NOTE — NC FL2 (Signed)
[place] MEDICAID FL2 LEVEL OF CARE SCREENING TOOL     IDENTIFICATION  Patient Name: Aaron [name] Birthdate: 1940-03-27 Sex: male Admission Date (Current Location): 05/09/2017  [facility] [place]   Facility and Address:  [facility], [address], [place], [place] [address]      Provider Number: [id number]  Attending Physician Name and Address:  Bradly Bienenstockrtmann, Fred, MD  Relative Name and Phone Number:  Lawanda CousinsJoseph Radin, son, [phone]    Current Level of Care: SNF Recommended Level of Care: Skilled Nursing Facility Prior Approval Number:    Date Approved/Denied:   PASRR Number: [id number]  Discharge Plan:      Current Diagnoses: Patient Active Problem List   Diagnosis Date Noted  . Closed bent bone fracture of left ulna 05/09/2017    Orientation RESPIRATION BLADDER Height & Weight     Self, Time, Situation, Place  Normal Continent Weight: 140 lb (63.5 kg) Height:  5\' 4"  (162.6 cm)  BEHAVIORAL SYMPTOMS/MOOD NEUROLOGICAL BOWEL NUTRITION STATUS      Continent Diet(See Dc Summary)  AMBULATORY STATUS COMMUNICATION OF NEEDS Skin   Extensive Assist Verbally Surgical wounds                       Personal Care Assistance Level of Assistance  Dressing, Bathing, Feeding Bathing Assistance: Maximum assistance Feeding assistance: Limited assistance Dressing Assistance: Maximum assistance     Functional Limitations Info  Sight, Hearing, Speech Sight Info: Adequate Hearing Info: Adequate Speech Info: Adequate    SPECIAL CARE FACTORS FREQUENCY  PT (By licensed PT), OT (By licensed OT)     PT Frequency: 5x week OT Frequency: 5x week            Contractures      Additional Factors Info  Code Status, Allergies Code Status Info: Full  Allergies Info: No Known Allergies           Current Medications (05/10/2017):  This is the current hospital active medication list Current Facility-Administered  Medications  Medication Dose Route Frequency Provider Last Rate Last Dose  . acetaminophen (TYLENOL) tablet 650 mg  650 mg Oral Q6H PRN Lambert ModyBarton, Samantha Bonham, PA      . aspirin EC tablet 81 mg  81 mg Oral Daily Bradly Bienenstockrtmann, Fred, MD   81 mg at 05/10/17 1059  . atorvastatin (LIPITOR) tablet 80 mg  80 mg Oral q1800 Bradly Bienenstockrtmann, Fred, MD      . ceFAZolin (ANCEF) IVPB 1 g/50 mL premix  1 g Intravenous Q8H Bradly Bienenstockrtmann, Fred, MD   Stopped at 05/10/17 1146  . dextrose 5 % and 0.45 % NaCl with KCl 20 mEq/L infusion   Intravenous Continuous Bradly Bienenstockrtmann, Fred, MD 10 mL/hr at 05/10/17 [phone]    . diphenhydrAMINE (BENADRYL) capsule 25-50 mg  25-50 mg Oral Q6H PRN Bradly Bienenstockrtmann, Fred, MD      . HYDROmorphone (DILAUDID) injection 0.5-1 mg  0.5-1 mg Intravenous Q2H PRN Bradly Bienenstockrtmann, Fred, MD      . lactated ringers infusion   Intravenous Continuous Bradly Bienenstockrtmann, Fred, MD   Stopped at 05/09/17 2006  . latanoprost (XALATAN) 0.005 % ophthalmic solution 1 drop  1 drop Both Eyes QHS Bradly Bienenstockrtmann, Fred, MD   1 drop at 05/09/17 2111  . lisinopril (PRINIVIL,ZESTRIL) tablet 10 mg  10 mg Oral Daily Bradly Bienenstockrtmann, Fred, MD   10 mg at 05/10/17 1059  . methocarbamol (ROBAXIN) tablet 500 mg  500 mg Oral Q6H PRN Bradly Bienenstockrtmann, Fred, MD  Or  . methocarbamol (ROBAXIN) 500 mg in dextrose 5 % 50 mL IVPB  500 mg Intravenous Q6H PRN Bradly Bienenstock, MD      . ondansetron [facility]) tablet 4 mg  4 mg Oral Q6H PRN Bradly Bienenstock, MD       Or  . ondansetron [facility]) injection 4 mg  4 mg Intravenous Q6H PRN Bradly Bienenstock, MD      . oxyCODONE-acetaminophen (PERCOCET/ROXICET) 5-325 MG per tablet 1-2 tablet  1-2 tablet Oral Q4H PRN Bradly Bienenstock, MD   1 tablet at 05/10/17 0416  . pantoprazole (PROTONIX) EC tablet 40 mg  40 mg Oral Daily Bradly Bienenstock, MD   40 mg at 05/10/17 1059  . phenytoin (DILANTIN) ER capsule 300 mg  300 mg Oral QHS Bradly Bienenstock, MD   300 mg at 05/09/17 2110  . traMADol (ULTRAM) tablet 50 mg  50 mg Oral Q6H PRN Lambert Mody Bonham, PA      . vitamin C  (ASCORBIC ACID) tablet 1,000 mg  1,000 mg Oral Daily Bradly Bienenstock, MD   1,000 mg at 05/10/17 1059     Discharge Medications: Please see discharge summary for a list of discharge medications.  Relevant Imaging Results:  Relevant Lab Results:   Additional Information SS#: [id number], LCSW

## 2017-05-11 MED ORDER — ASCORBIC ACID 1000 MG PO TABS: 1000.0000 mg | ORAL_TABLET | Freq: Every day | ORAL | 0 refills | Status: AC

## 2017-05-11 MED ORDER — METHOCARBAMOL 500 MG PO TABS: 500.0000 mg | ORAL_TABLET | Freq: Four times a day (QID) | ORAL | 0 refills | Status: AC | PRN

## 2017-05-11 MED ORDER — TRAMADOL HCL 50 MG PO TABS: 50.0000 mg | ORAL_TABLET | Freq: Four times a day (QID) | ORAL | 0 refills | Status: AC | PRN

## 2017-05-11 NOTE — Progress Notes (Signed)
NURSING PROGRESS NOTE  Aaron CoffeeCharles Freeman [name] [id number] Discharge Data: 05/11/2017 4:52 PM Attending Provider: Bradly Bienenstockrtmann, Fred, MD OZH:[id number]:Badger, Kayleen MemosMichael C, MD     Aaron Coffeeharles Freeman [name] to be D/C'd [facility]  per MD order.  Discussed with the patient the After Visit Summary and all questions fully answered. All IV's discontinued with no bleeding noted. All belongings returned to patient for patient to take home. Made multiple attempts to call report on this patient with no success. Left a message for that nurse to return my call.  Last Vital Signs:  Blood pressure 113/66, pulse 67, temperature 97.8 F (36.6 C), temperature source Oral, resp. rate 18, height 5\' 4"  (1.626 m), weight 63.5 kg (140 lb), SpO2 97 %.  Discharge Medication List Allergies as of 05/11/2017   No Known Allergies     Medication List    TAKE these medications   ascorbic acid 1000 MG tablet Commonly known as:  VITAMIN C Take 1 tablet (1,000 mg total) by mouth daily for 15 days. Start taking on:  05/12/2017   aspirin EC 81 MG tablet Take 81 mg by mouth daily.   atorvastatin 80 MG tablet Commonly known as:  LIPITOR Take 80 mg by mouth daily.   DRY EYES OP Place 1 drop into both eyes at bedtime.   HYDROcodone-acetaminophen 5-325 MG tablet Commonly known as:  NORCO/VICODIN Take 1 tablet by mouth every 6 (six) hours as needed.   latanoprost 0.005 % ophthalmic solution Commonly known as:  XALATAN Place 1 drop into both eyes at bedtime.   lisinopril 10 MG tablet Commonly known as:  PRINIVIL,ZESTRIL Take 10 mg by mouth daily.   methocarbamol 500 MG tablet Commonly known as:  ROBAXIN Take 1 tablet (500 mg total) by mouth every 6 (six) hours as needed for up to 15 days for muscle spasms.   pantoprazole 40 MG tablet Commonly known as:  PROTONIX Take 40 mg by mouth daily.   phenytoin 100 MG ER capsule Commonly known as:  DILANTIN Take 300 mg by mouth at bedtime.   traMADol 50 MG  tablet Commonly known as:  ULTRAM Take 1 tablet (50 mg total) by mouth every 6 (six) hours as needed for up to 5 days for moderate pain.

## 2017-05-11 NOTE — Social Work (Signed)
CSW discussed SNF offer with son. [facility]. [facility] and [facility]. CSW then called son back and he [facility]. CSW then called SNF and they advised that [facility].  1:17pm: CSW able to make contact with son and discussed SNF offers again. Son accepted SNF bed offer from [facility]. CSW confirmed bed offer with SNF and they will initiate Insurance Auth.  CSW will continue to follow as patient dc today.  Keene BreathPatricia Rockney Grenz, LCSW Clinical Social Worker [phone]
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
## 2017-05-11 NOTE — Social Work (Addendum)
CSW was advised by [facility] that Insurance Auth is still pending.  CSW will f/u as patient will have to dc tomorrow morning.  CSW will continue to follow.  Keene BreathPatricia Yoneko Talerico, LCSW Clinical Social Worker [phone]

## 2017-05-11 NOTE — Progress Notes (Signed)
Subjective: 2 Days Post-Op Procedure(s) (LRB): OPEN REDUCTION INTERNAL FIXATION (ORIF) ELBOW/OLECRANON FRACTURE (Left) Patient reports pain as 2 on 0-10 scale.   The patient is laying in bed comfortably eating lunch. He notes mild discomfort that is well-controlled with the pain medicine.  He has tolerated food well with no difficulty.  He states that the splint is comfortable. He denies shortness of breath, chest pain, fever, chills, nausea, vomiting, or diarrhea.  Objective: Vital signs in last 24 hours: Temp:  [97.8 F (36.6 C)-98.3 F (36.8 C)] 97.8 F (36.6 C) (04/05 1036) Pulse Rate:  [62-79] 67 (04/05 1036) BP: (90-123)/(53-66) 113/66 (04/05 1036) SpO2:  [93 %-98 %] 97 % (04/05 1036)  Intake/Output from previous day: 04/04 0701 - 04/05 0700 In: 600 [P.O.:500; IV Piggyback:100] Out: 750 [Urine:750] Intake/Output this shift: Total I/O In: 336.8 [I.V.:286.8; IV Piggyback:50] Out: 500 [Urine:500]  Recent Labs    05/09/17 1430  HGB 11.2*   Recent Labs    05/09/17 1430  WBC 4.3  RBC 3.97*  HCT 35.0*  PLT 198   Recent Labs    05/09/17 1430  NA 134*  K 4.0  CL 98*  CO2 25  BUN 16  CREATININE 0.71  GLUCOSE 114*  CALCIUM 8.3*   No results for input(s): LABPT, INR in the last 72 hours.  Neurovascular intact Sensation intact distally Intact pulses distally Splint is clean, dry and intact.   Capillary refill less than 2 seconds.  Able to wiggle all fingers with no difficulty.    Assessment/Plan: 2 Days Post-Op Procedure(s) (LRB): OPEN REDUCTION INTERNAL FIXATION (ORIF) ELBOW/OLECRANON FRACTURE (Left) Up with therapy Discharge to SNF when placement is available. Continue with pain management as needed.    Karma GreaserSamantha Bonham Halvor Behrend 05/11/2017, 12:40 PM
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
## 2017-05-11 NOTE — Social Work (Signed)
Clinical Social Worker facilitated patient discharge including contacting patient family and facility to confirm patient discharge plans.  Clinical information faxed to facility and family agreeable with plan.    CSW arranged ambulance transport via [facility] to [facility].    RN to call [phone] to give  report prior to discharge.  Clinical Social Worker will sign off for now as social work intervention is no longer needed. Please consult us again if new need arises.  Delaila Nand, LCSW Clinical Social Worker [phone]    

## 2017-05-11 NOTE — Clinical Social Work Placement (Signed)
   CLINICAL SOCIAL WORK PLACEMENT  NOTE  Date:  05/11/2017  Patient Details  Name: Aaron [name] MRN: [id number] Date of Birth: October 30, 1940  Clinical Social Work is seeking post-discharge placement for this patient at the Skilled  Nursing Facility level of care (*CSW will initial, date and re-position this form in  chart as items are completed):  Yes   Patient/family provided with [facility] Clinical Social Work Department's list of facilities offering this level of care within the geographic area requested by the patient (or if unable, by the patient's family).  Yes   Patient/family informed of their freedom to choose among providers that offer the needed level of care, that participate in Medicare, Medicaid or managed care program needed by the patient, have an available bed and are willing to accept the patient.  Yes   Patient/family informed of [facility]'s ownership interest in [facility] and [facility], as well as of the fact that they are under no obligation to receive care at these facilities.  PASRR submitted to EDS on       PASRR number received on 05/10/17     Existing PASRR number confirmed on       FL2 transmitted to all facilities in geographic area requested by pt/family on 05/10/17     FL2 transmitted to all facilities within larger geographic area on       Patient informed that his/her managed care company has contracts with or will negotiate with certain facilities, including the following:        Yes   Patient/family informed of bed offers received.  [facility]     Physician recommends and patient chooses bed at      [facility].  Patient to be transferred to facility by [facility]     Patient family notified on 05/11/17 of transfer.  Name of family member notified:  son, [name] contacted     PHYSICIAN       Additional Comment:     _______________________________________________ Tresa MoorePatricia V Danyal Whitenack, LCSW 05/11/2017, 1:40 PM

## 2017-05-15 ENCOUNTER — Encounter (HOSPITAL_COMMUNITY): Payer: Self-pay | Admitting: Orthopedic Surgery

## 2020-03-17 ENCOUNTER — Encounter: Payer: Self-pay | Admitting: General Practice

## 2020-10-16 ENCOUNTER — Emergency Department (HOSPITAL_COMMUNITY): Payer: Medicare Other

## 2020-10-16 ENCOUNTER — Inpatient Hospital Stay (HOSPITAL_COMMUNITY)
Admission: EM | Admit: 2020-10-16 | Discharge: 2020-10-28 | DRG: 481 | Disposition: A | Discharge status: Skilled Nursing Facility | Payer: Medicare Other | Attending: Internal Medicine | Admitting: Internal Medicine

## 2020-10-16 DIAGNOSIS — M25511 Pain in right shoulder: Secondary | ICD-10-CM | POA: Diagnosis present

## 2020-10-16 DIAGNOSIS — S0990XS Unspecified injury of head, sequela: Secondary | ICD-10-CM

## 2020-10-16 DIAGNOSIS — E785 Hyperlipidemia, unspecified: Secondary | ICD-10-CM

## 2020-10-16 DIAGNOSIS — Q6689 Other  specified congenital deformities of feet: Secondary | ICD-10-CM

## 2020-10-16 DIAGNOSIS — Z87898 Personal history of other specified conditions: Secondary | ICD-10-CM

## 2020-10-16 DIAGNOSIS — M978XXA Periprosthetic fracture around other internal prosthetic joint, initial encounter: Secondary | ICD-10-CM

## 2020-10-16 DIAGNOSIS — Z7982 Long term (current) use of aspirin: Secondary | ICD-10-CM

## 2020-10-16 DIAGNOSIS — D649 Anemia, unspecified: Secondary | ICD-10-CM | POA: Diagnosis present

## 2020-10-16 DIAGNOSIS — R52 Pain, unspecified: Secondary | ICD-10-CM | POA: Diagnosis not present

## 2020-10-16 DIAGNOSIS — I951 Orthostatic hypotension: Secondary | ICD-10-CM

## 2020-10-16 DIAGNOSIS — M9701XA Periprosthetic fracture around internal prosthetic right hip joint, initial encounter: Secondary | ICD-10-CM | POA: Diagnosis not present

## 2020-10-16 DIAGNOSIS — E860 Dehydration: Secondary | ICD-10-CM | POA: Diagnosis not present

## 2020-10-16 DIAGNOSIS — W19XXXA Unspecified fall, initial encounter: Secondary | ICD-10-CM

## 2020-10-16 DIAGNOSIS — S72009A Fracture of unspecified part of neck of unspecified femur, initial encounter for closed fracture: Secondary | ICD-10-CM

## 2020-10-16 DIAGNOSIS — Z96649 Presence of unspecified artificial hip joint: Secondary | ICD-10-CM

## 2020-10-16 DIAGNOSIS — G8191 Hemiplegia, unspecified affecting right dominant side: Secondary | ICD-10-CM | POA: Diagnosis present

## 2020-10-16 DIAGNOSIS — L8990 Pressure ulcer of unspecified site, unspecified stage: Secondary | ICD-10-CM | POA: Insufficient documentation

## 2020-10-16 DIAGNOSIS — L89621 Pressure ulcer of left heel, stage 1: Secondary | ICD-10-CM | POA: Diagnosis present

## 2020-10-16 DIAGNOSIS — W010XXA Fall on same level from slipping, tripping and stumbling without subsequent striking against object, initial encounter: Secondary | ICD-10-CM | POA: Diagnosis present

## 2020-10-16 DIAGNOSIS — E440 Moderate protein-calorie malnutrition: Secondary | ICD-10-CM | POA: Insufficient documentation

## 2020-10-16 DIAGNOSIS — E871 Hypo-osmolality and hyponatremia: Secondary | ICD-10-CM

## 2020-10-16 DIAGNOSIS — M96661 Fracture of femur following insertion of orthopedic implant, joint prosthesis, or bone plate, right leg: Secondary | ICD-10-CM

## 2020-10-16 DIAGNOSIS — G40909 Epilepsy, unspecified, not intractable, without status epilepticus: Secondary | ICD-10-CM

## 2020-10-16 DIAGNOSIS — I10 Essential (primary) hypertension: Secondary | ICD-10-CM | POA: Diagnosis present

## 2020-10-16 DIAGNOSIS — E538 Deficiency of other specified B group vitamins: Secondary | ICD-10-CM

## 2020-10-16 DIAGNOSIS — D62 Acute posthemorrhagic anemia: Secondary | ICD-10-CM

## 2020-10-16 DIAGNOSIS — Z96641 Presence of right artificial hip joint: Secondary | ICD-10-CM | POA: Diagnosis present

## 2020-10-16 DIAGNOSIS — Z79899 Other long term (current) drug therapy: Secondary | ICD-10-CM

## 2020-10-16 DIAGNOSIS — Z20822 Contact with and (suspected) exposure to covid-19: Secondary | ICD-10-CM | POA: Diagnosis present

## 2020-10-16 DIAGNOSIS — Z6824 Body mass index (BMI) 24.0-24.9, adult: Secondary | ICD-10-CM

## 2020-10-16 DIAGNOSIS — S72001A Fracture of unspecified part of neck of right femur, initial encounter for closed fracture: Secondary | ICD-10-CM | POA: Insufficient documentation

## 2020-10-16 DIAGNOSIS — S72351A Displaced comminuted fracture of shaft of right femur, initial encounter for closed fracture: Secondary | ICD-10-CM

## 2020-10-16 DIAGNOSIS — Z01818 Encounter for other preprocedural examination: Secondary | ICD-10-CM

## 2020-10-16 DIAGNOSIS — Z419 Encounter for procedure for purposes other than remedying health state, unspecified: Secondary | ICD-10-CM

## 2020-10-16 IMAGING — CR DG FEMUR 2+V*R*
4 series · 4 of 4 positions shown · non-contrast
Comparison: None.

CLINICAL DATA: Fall, deformity

EXAM:
RIGHT FEMUR 2 VIEWS

[femur ap (1 of 2)]
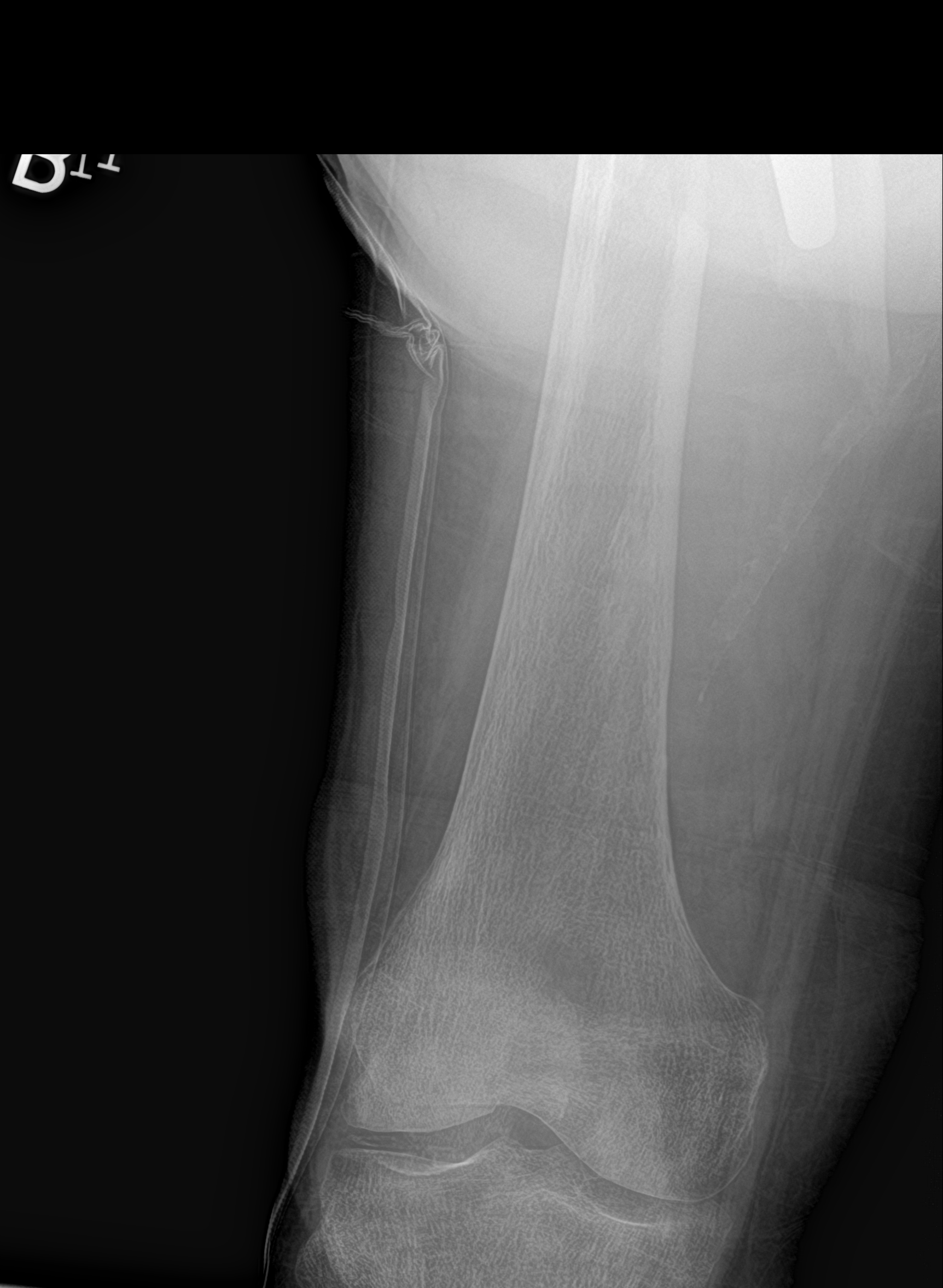

[femur ap (2 of 2)]
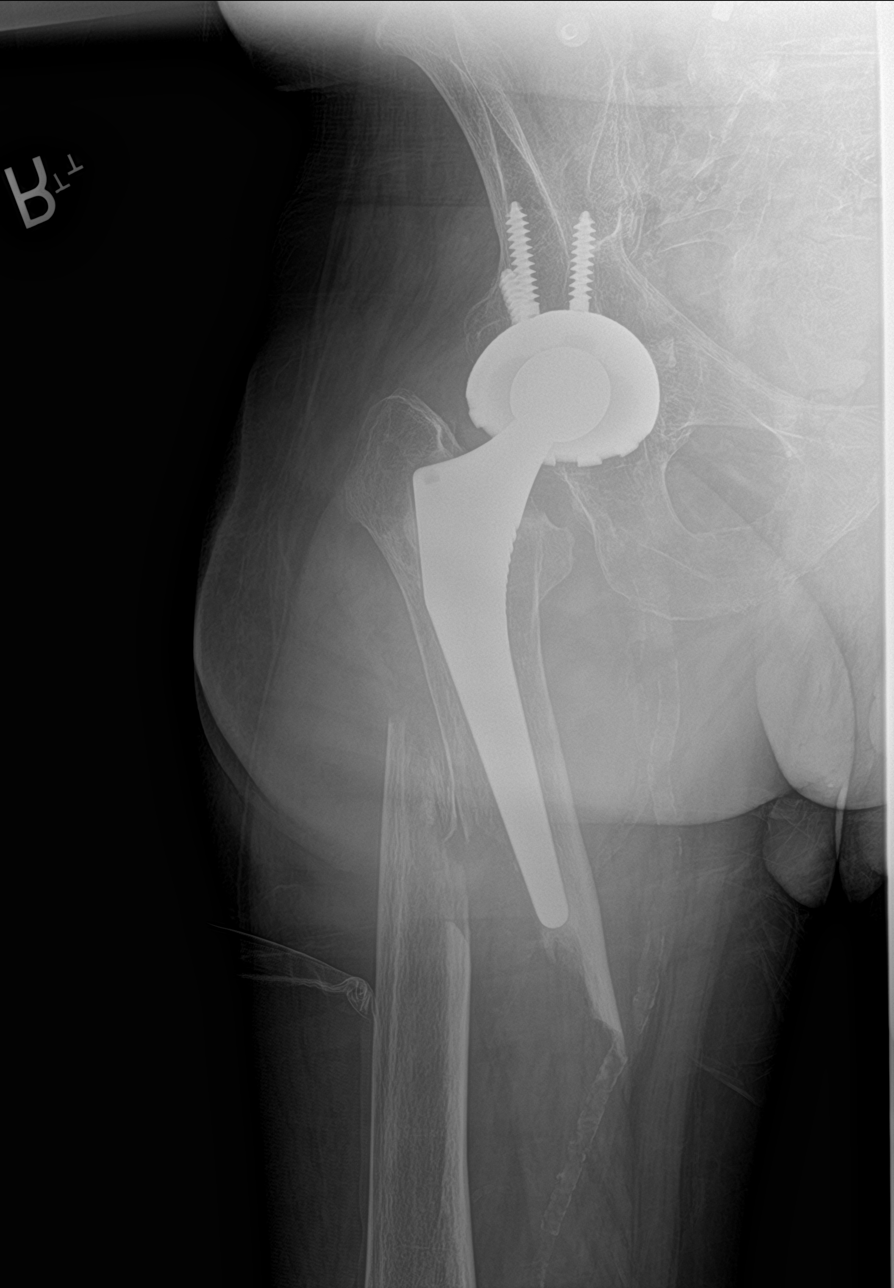

[femur lat (1 of 2)]
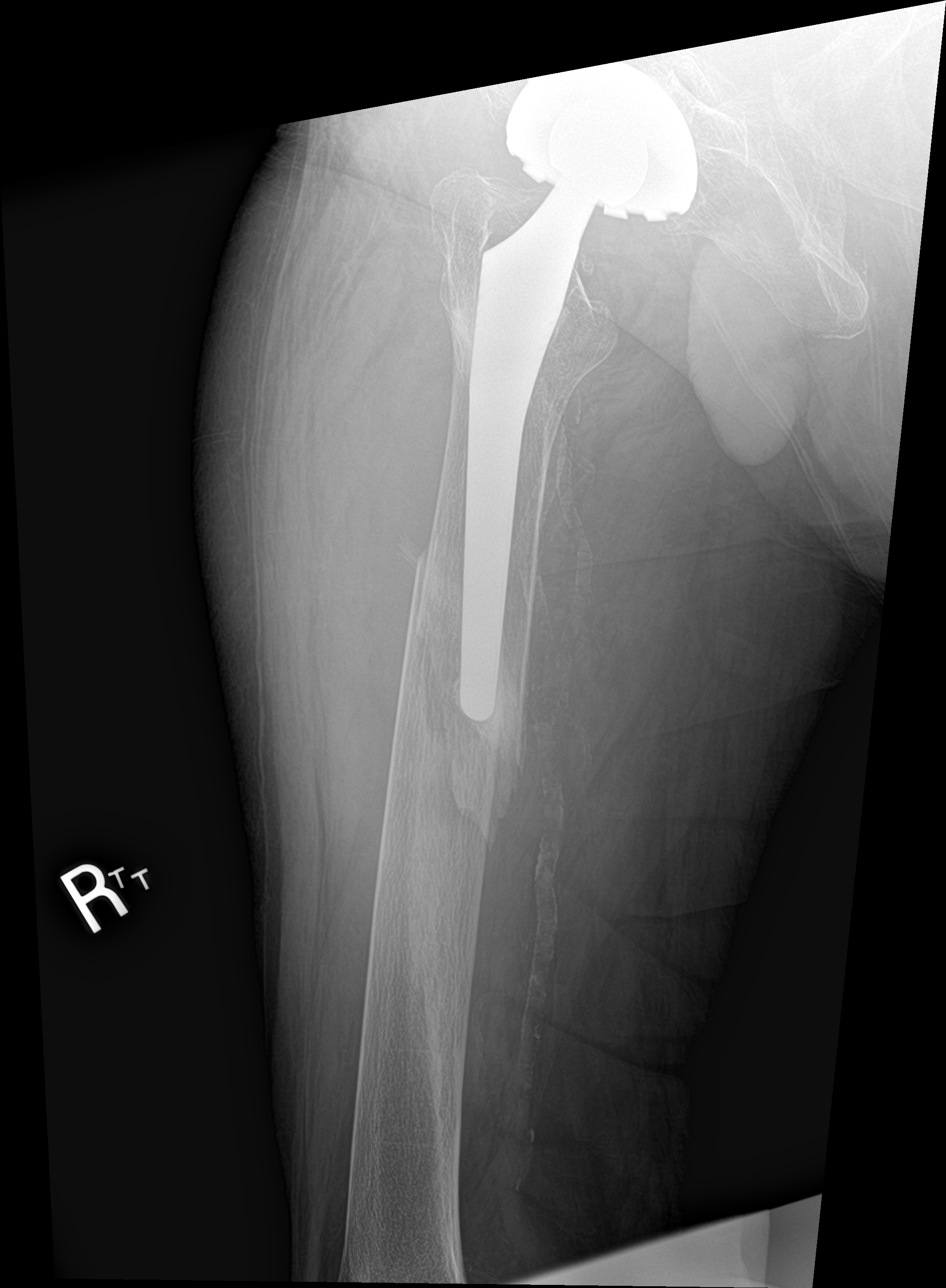

[femur lat (2 of 2)]
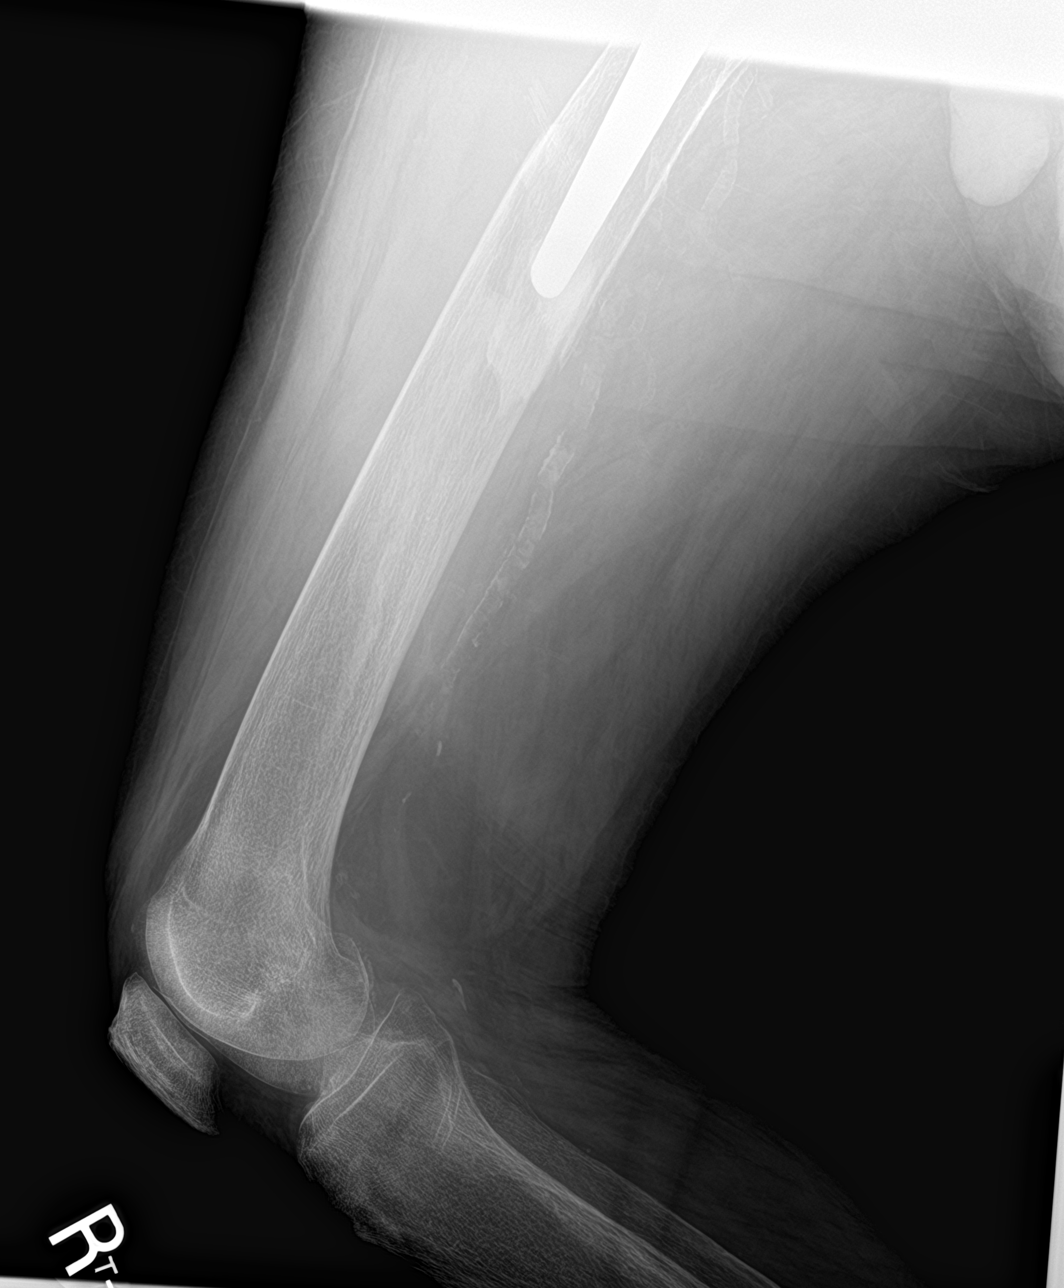

[4 of 4 positions shown; findings below may reference images not displayed]

FINDINGS: Frontal and lateral views of the right femur are obtained. A right
hip arthroplasty is identified. An oblique comminuted displaced
periprosthetic fracture is seen along the distal margin of the
femoral component of the hip arthroplasty, involving the proximal
right femoral diaphysis. There is significant valgus angulation and
lateral displacement of the distal fracture fragment.

No other acute displaced fractures are identified. The bones are
severely osteopenic.
IMPRESSION: 1. Comminuted displaced periprosthetic proximal right femoral
diaphyseal fracture along the distal margin of the femoral component
of the right hip arthroplasty. Significant valgus angulation and
lateral displacement of the distal fracture fragment.
2. Diffuse osteopenia.

## 2020-10-16 IMAGING — CR DG PELVIS 1-2V
1 series · 1 of 1 positions shown · non-contrast
Comparison: None.

CLINICAL DATA: Fell, deformity

EXAM:
PELVIS - 1-2 VIEW

[pelvis ap]
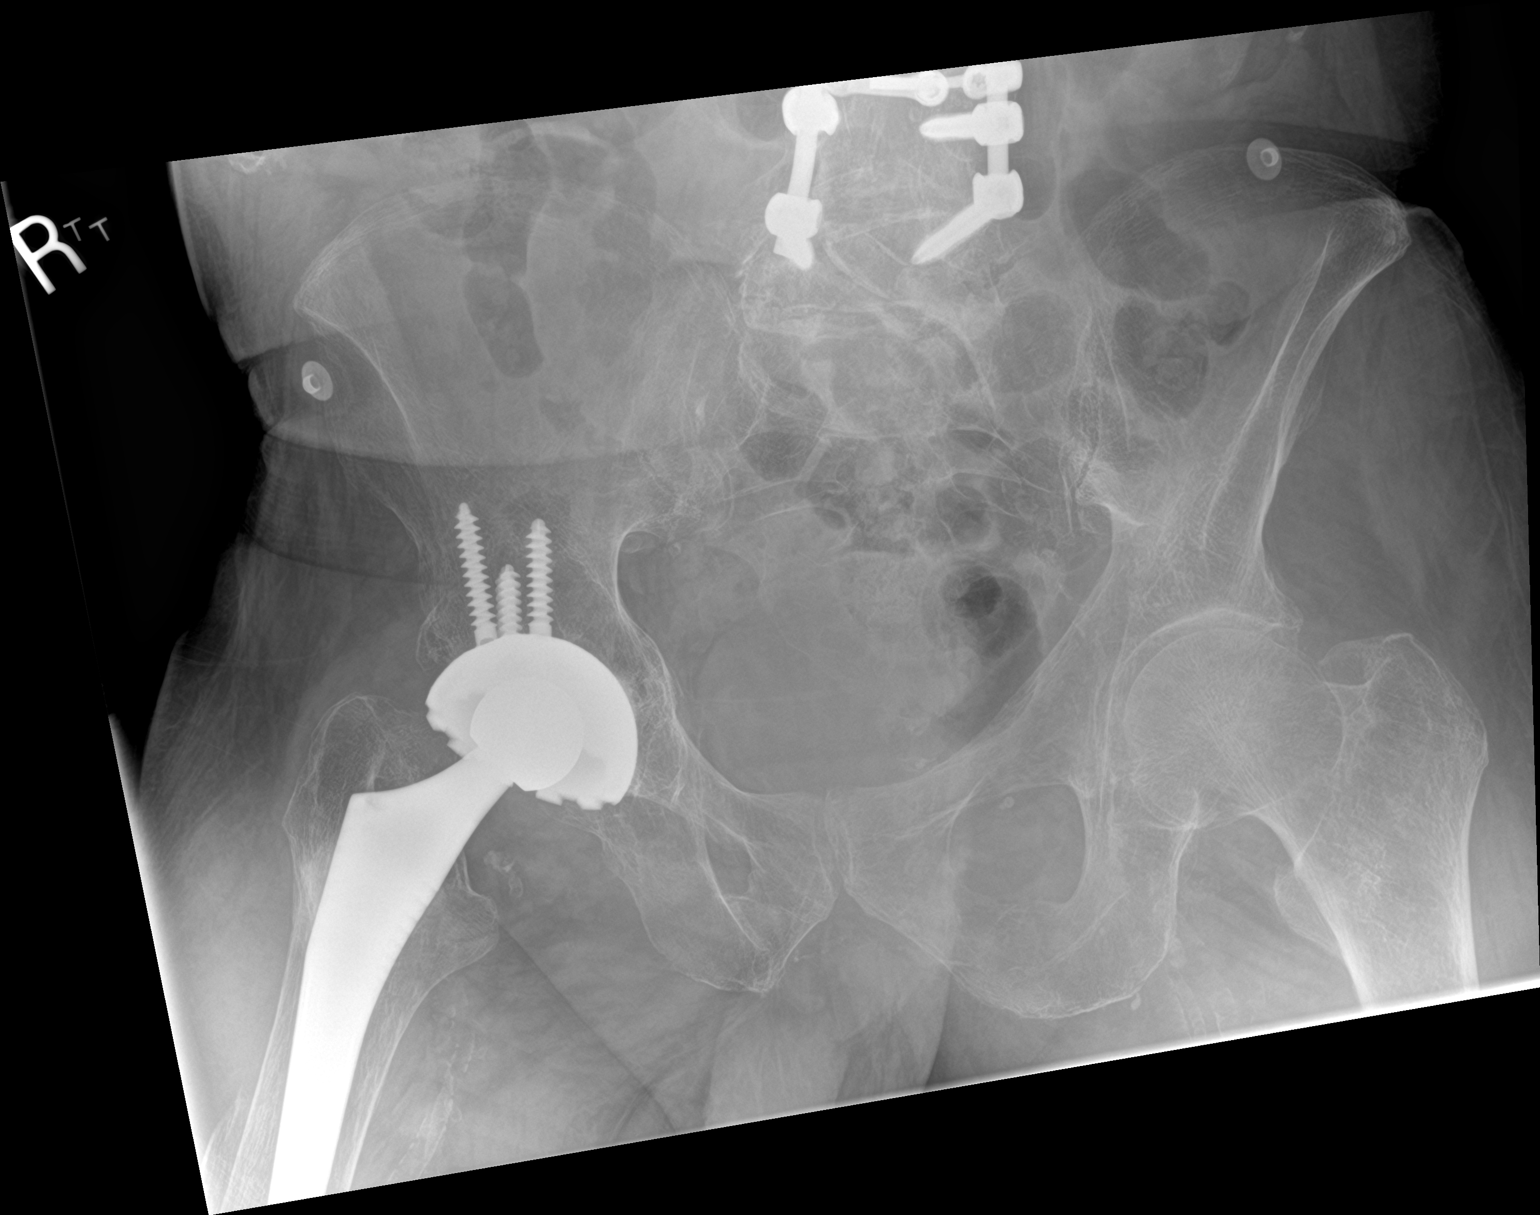

[1 of 1 positions shown; findings below may reference images not displayed]

FINDINGS: Single frontal view of the pelvis was obtained. The periprosthetic
right femoral diaphyseal fracture seen on corresponding femur
examination is partially visualized on this study. The acetabular
component of the right hip arthroplasty is unremarkable. The bones
are diffusely osteopenic. There is mild left hip osteoarthritis.
Posterior fusion is identified at the lumbosacral junction.
IMPRESSION: 1. Partial visualization of the proximal right femoral
periprosthetic fracture. Please refer to dedicated right femur
x-rays.
2. Diffuse osteopenia.

## 2020-10-16 MED ORDER — SODIUM CHLORIDE 0.9 % IV BOLUS
1000.0000 mL | Freq: Once | INTRAVENOUS | Status: AC
  Administered 2020-10-16: 1000 mL via INTRAVENOUS

## 2020-10-16 MED ORDER — MORPHINE SULFATE (PF) 4 MG/ML IV SOLN: 4.0000 mg | Freq: Once | INTRAVENOUS | Status: DC

## 2020-10-16 MED ORDER — ONDANSETRON HCL 4 MG/2ML IJ SOLN
4.0000 mg | Freq: Once | INTRAMUSCULAR | Status: DC
  Filled 2020-10-16: qty 2
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
## 2020-10-16 NOTE — ED Notes (Signed)
Pt transported to XR.  

## 2020-10-16 NOTE — ED Notes (Signed)
Granddaughter Cala Bradford [phone] would like an update and to talk to patient

## 2020-10-16 NOTE — ED Triage Notes (Signed)
Pt comes from home after fall to his right side. EMS reports shortening and rotation to the right leg with sensation and pulses intact distal to injury. Pt was administered 250 mcg of fentanyl en route which brought pain from an 8 down to a 2. Initial pressure was 80 palpated, EMS administered NS en route. Last BP 134/74. GCS of 15 throughout.

## 2020-10-16 NOTE — Progress Notes (Signed)
Case discussed with EDP.  Patient will need surgical fixation of the right periprosthetic hip fracture.  This is outside of my orthopedic scope.  Would recommend admit to medicine and I will consult with colleagues for getting this patient set up with appropriate orthopedic specialist.  No surgery planned for this weekend.  Hold long acting chemical DVT ppx.

## 2020-10-17 ENCOUNTER — Other Ambulatory Visit: Payer: Self-pay

## 2020-10-17 ENCOUNTER — Encounter (HOSPITAL_COMMUNITY): Payer: Self-pay | Admitting: Internal Medicine

## 2020-10-17 ENCOUNTER — Inpatient Hospital Stay (HOSPITAL_COMMUNITY): Payer: Medicare Other

## 2020-10-17 DIAGNOSIS — D649 Anemia, unspecified: Secondary | ICD-10-CM | POA: Diagnosis not present

## 2020-10-17 DIAGNOSIS — Z20822 Contact with and (suspected) exposure to covid-19: Secondary | ICD-10-CM | POA: Diagnosis present

## 2020-10-17 DIAGNOSIS — E871 Hypo-osmolality and hyponatremia: Secondary | ICD-10-CM | POA: Diagnosis not present

## 2020-10-17 DIAGNOSIS — M978XXA Periprosthetic fracture around other internal prosthetic joint, initial encounter: Secondary | ICD-10-CM

## 2020-10-17 DIAGNOSIS — M9701XA Periprosthetic fracture around internal prosthetic right hip joint, initial encounter: Secondary | ICD-10-CM | POA: Diagnosis present

## 2020-10-17 DIAGNOSIS — Z6824 Body mass index (BMI) 24.0-24.9, adult: Secondary | ICD-10-CM | POA: Diagnosis not present

## 2020-10-17 DIAGNOSIS — I951 Orthostatic hypotension: Secondary | ICD-10-CM | POA: Diagnosis not present

## 2020-10-17 DIAGNOSIS — L89621 Pressure ulcer of left heel, stage 1: Secondary | ICD-10-CM | POA: Diagnosis present

## 2020-10-17 DIAGNOSIS — E785 Hyperlipidemia, unspecified: Secondary | ICD-10-CM | POA: Diagnosis present

## 2020-10-17 DIAGNOSIS — Z7982 Long term (current) use of aspirin: Secondary | ICD-10-CM | POA: Diagnosis not present

## 2020-10-17 DIAGNOSIS — I10 Essential (primary) hypertension: Secondary | ICD-10-CM | POA: Diagnosis present

## 2020-10-17 DIAGNOSIS — E440 Moderate protein-calorie malnutrition: Secondary | ICD-10-CM | POA: Diagnosis present

## 2020-10-17 DIAGNOSIS — Q6689 Other  specified congenital deformities of feet: Secondary | ICD-10-CM | POA: Diagnosis not present

## 2020-10-17 DIAGNOSIS — Z96641 Presence of right artificial hip joint: Secondary | ICD-10-CM | POA: Diagnosis present

## 2020-10-17 DIAGNOSIS — E538 Deficiency of other specified B group vitamins: Secondary | ICD-10-CM | POA: Diagnosis present

## 2020-10-17 DIAGNOSIS — Z87898 Personal history of other specified conditions: Secondary | ICD-10-CM

## 2020-10-17 DIAGNOSIS — G40909 Epilepsy, unspecified, not intractable, without status epilepticus: Secondary | ICD-10-CM | POA: Diagnosis present

## 2020-10-17 DIAGNOSIS — Z96649 Presence of unspecified artificial hip joint: Secondary | ICD-10-CM | POA: Diagnosis not present

## 2020-10-17 DIAGNOSIS — D62 Acute posthemorrhagic anemia: Secondary | ICD-10-CM | POA: Diagnosis present

## 2020-10-17 DIAGNOSIS — S0990XS Unspecified injury of head, sequela: Secondary | ICD-10-CM | POA: Diagnosis not present

## 2020-10-17 DIAGNOSIS — R52 Pain, unspecified: Secondary | ICD-10-CM | POA: Diagnosis present

## 2020-10-17 DIAGNOSIS — G8191 Hemiplegia, unspecified affecting right dominant side: Secondary | ICD-10-CM | POA: Diagnosis present

## 2020-10-17 DIAGNOSIS — E860 Dehydration: Secondary | ICD-10-CM | POA: Diagnosis not present

## 2020-10-17 DIAGNOSIS — M978XXS Periprosthetic fracture around other internal prosthetic joint, sequela: Secondary | ICD-10-CM | POA: Diagnosis not present

## 2020-10-17 DIAGNOSIS — W010XXA Fall on same level from slipping, tripping and stumbling without subsequent striking against object, initial encounter: Secondary | ICD-10-CM | POA: Diagnosis present

## 2020-10-17 DIAGNOSIS — M25511 Pain in right shoulder: Secondary | ICD-10-CM | POA: Diagnosis present

## 2020-10-17 DIAGNOSIS — Z79899 Other long term (current) drug therapy: Secondary | ICD-10-CM | POA: Diagnosis not present

## 2020-10-17 LAB — ABO/RH: ABO/RH(D): AB POS

## 2020-10-17 LAB — CBC
HCT: 26.5 % — ABNORMAL LOW (ref 39.0–52.0)
Hemoglobin: 8.7 g/dL — ABNORMAL LOW (ref 13.0–17.0)
MCH: 27.6 pg (ref 26.0–34.0)
MCHC: 32.8 g/dL (ref 30.0–36.0)
MCV: 84.1 fL (ref 80.0–100.0)
Platelets: 204 10*3/uL (ref 150–400)
RBC: 3.15 MIL/uL — ABNORMAL LOW (ref 4.22–5.81)
RDW: 15.8 % — ABNORMAL HIGH (ref 11.5–15.5)
WBC: 8.2 10*3/uL (ref 4.0–10.5)
nRBC: 0 % (ref 0.0–0.2)

## 2020-10-17 LAB — COMPREHENSIVE METABOLIC PANEL
ALT: 17 U/L (ref 0–44)
AST: 22 U/L (ref 15–41)
Albumin: 3.2 g/dL — ABNORMAL LOW (ref 3.5–5.0)
Alkaline Phosphatase: 112 U/L (ref 38–126)
Anion gap: 12 (ref 5–15)
BUN: 12 mg/dL (ref 8–23)
CO2: 22 mmol/L (ref 22–32)
Calcium: 8.1 mg/dL — ABNORMAL LOW (ref 8.9–10.3)
Chloride: 101 mmol/L (ref 98–111)
Creatinine, Ser: 0.66 mg/dL (ref 0.61–1.24)
GFR, Estimated: >60 mL/min (ref >60)
Glucose, Bld: 105 mg/dL — ABNORMAL HIGH (ref 70–99)
Potassium: 3.9 mmol/L (ref 3.5–5.1)
Sodium: 135 mmol/L (ref 135–145)
Total Bilirubin: 0.6 mg/dL (ref 0.3–1.2)
Total Protein: 4.4 g/dL — ABNORMAL LOW (ref 6.5–8.1)

## 2020-10-17 LAB — FERRITIN: Ferritin: 248 ng/mL (ref 24–336)

## 2020-10-17 LAB — CBC WITH DIFFERENTIAL/PLATELET
Abs Immature Granulocytes: 0.06 10*3/uL (ref 0.00–0.07)
Basophils Absolute: 0 10*3/uL (ref 0.0–0.1)
Basophils Relative: 0 %
Eosinophils Absolute: 0 10*3/uL (ref 0.0–0.5)
Eosinophils Relative: 0 %
HCT: 29.6 % — ABNORMAL LOW (ref 39.0–52.0)
Hemoglobin: 9.5 g/dL — ABNORMAL LOW (ref 13.0–17.0)
Immature Granulocytes: 0 %
Lymphocytes Relative: 5 %
Lymphs Abs: 0.7 10*3/uL (ref 0.7–4.0)
MCH: 27.7 pg (ref 26.0–34.0)
MCHC: 32.1 g/dL (ref 30.0–36.0)
MCV: 86.3 fL (ref 80.0–100.0)
Monocytes Absolute: 0.9 10*3/uL (ref 0.1–1.0)
Monocytes Relative: 6 %
Neutro Abs: 12.4 10*3/uL — ABNORMAL HIGH (ref 1.7–7.7)
Neutrophils Relative %: 89 %
Platelets: 228 10*3/uL (ref 150–400)
RBC: 3.43 MIL/uL — ABNORMAL LOW (ref 4.22–5.81)
RDW: 15.8 % — ABNORMAL HIGH (ref 11.5–15.5)
WBC: 14 10*3/uL — ABNORMAL HIGH (ref 4.0–10.5)
nRBC: 0 % (ref 0.0–0.2)

## 2020-10-17 LAB — FOLATE: Folate: 5.1 ng/mL — ABNORMAL LOW (ref >5.9)

## 2020-10-17 LAB — BASIC METABOLIC PANEL
Anion gap: 5 (ref 5–15)
BUN: 11 mg/dL (ref 8–23)
CO2: 23 mmol/L (ref 22–32)
Calcium: 8 mg/dL — ABNORMAL LOW (ref 8.9–10.3)
Chloride: 103 mmol/L (ref 98–111)
Creatinine, Ser: 0.72 mg/dL (ref 0.61–1.24)
GFR, Estimated: >60 mL/min (ref >60)
Glucose, Bld: 137 mg/dL — ABNORMAL HIGH (ref 70–99)
Potassium: 4.2 mmol/L (ref 3.5–5.1)
Sodium: 131 mmol/L — ABNORMAL LOW (ref 135–145)

## 2020-10-17 LAB — IRON AND TIBC
Iron: 65 ug/dL (ref 45–182)
Saturation Ratios: 26 % (ref 17.9–39.5)
TIBC: 252 ug/dL (ref 250–450)
UIBC: 187 ug/dL

## 2020-10-17 LAB — RETICULOCYTES
Immature Retic Fract: 14.3 % (ref 2.3–15.9)
RBC.: 3.17 MIL/uL — ABNORMAL LOW (ref 4.22–5.81)
Retic Count, Absolute: 34.6 10*3/uL (ref 19.0–186.0)
Retic Ct Pct: 1.1 % (ref 0.4–3.1)

## 2020-10-17 LAB — RESP PANEL BY RT-PCR (FLU A&B, COVID) ARPGX2
Influenza A by PCR: NEGATIVE
Influenza B by PCR: NEGATIVE
SARS Coronavirus 2 by RT PCR: NEGATIVE

## 2020-10-17 LAB — VITAMIN B12: Vitamin B-12: 151 pg/mL — ABNORMAL LOW (ref 180–914)

## 2020-10-17 LAB — PHENYTOIN LEVEL, TOTAL: Phenytoin Lvl: 23.1 ug/mL — ABNORMAL HIGH (ref 10.0–20.0)

## 2020-10-17 LAB — PREPARE RBC (CROSSMATCH)

## 2020-10-17 IMAGING — DX DG CHEST 1V PORT
1 series · 1 of 1 positions shown · non-contrast
Comparison: Chest radiograph dated [DATE].

CLINICAL DATA: Preop chest radiograph.

EXAM:
PORTABLE CHEST 1 VIEW

[chest ap]
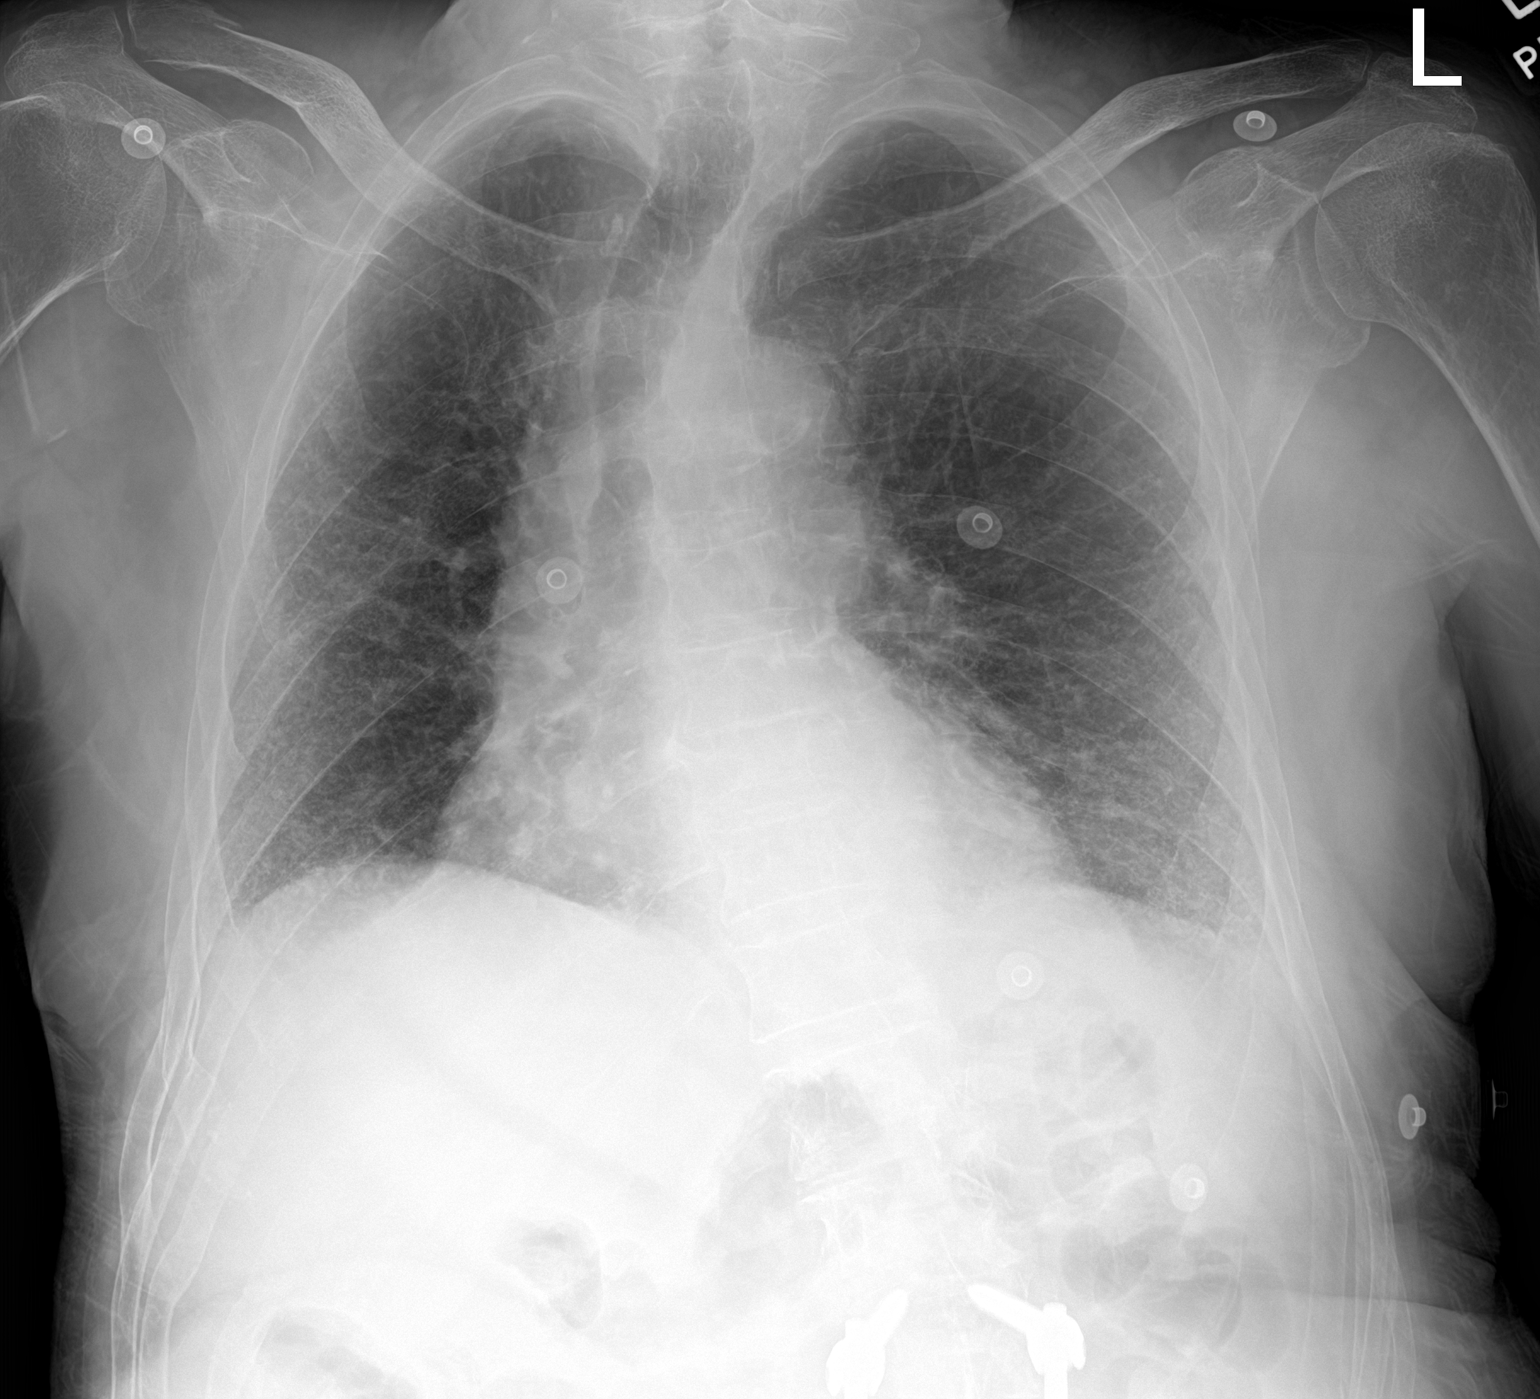

[1 of 1 positions shown; findings below may reference images not displayed]

FINDINGS: Diffuse chronic interstitial coarsening. No focal consolidation,
pleural effusion, or pneumothorax. Top-normal cardiac size.
Atherosclerotic calcification of the aorta. Osteopenia with
degenerative changes of the spine. Partially visualized lumbar
fusion hardware. No acute osseous pathology.
IMPRESSION: No active disease.

## 2020-10-17 MED ORDER — CALCIUM-MAGNESIUM-ZINC 333-133-5 MG PO TABS: ORAL_TABLET | Freq: Every day | ORAL | Status: DC

## 2020-10-17 MED ORDER — ATORVASTATIN CALCIUM 40 MG PO TABS
80.0000 mg | ORAL_TABLET | Freq: Every day | ORAL | Status: DC
  Administered 2020-10-17 – 2020-10-28 (×10): 80 mg via ORAL
  Filled 2020-10-17 (×2): qty 1
  Filled 2020-10-17 (×2): qty 2
  Filled 2020-10-17: qty 1
  Filled 2020-10-17 (×6): qty 2

## 2020-10-17 MED ORDER — CEROVITE SENIOR PO TABS: 1.0000 | ORAL_TABLET | Freq: Every day | ORAL | Status: DC

## 2020-10-17 MED ORDER — PHENYTOIN SODIUM EXTENDED 100 MG PO CAPS
300.0000 mg | ORAL_CAPSULE | Freq: Once | ORAL | Status: AC
  Administered 2020-10-17: 300 mg via ORAL
  Filled 2020-10-17: qty 3

## 2020-10-17 MED ORDER — CYANOCOBALAMIN 1000 MCG/ML IJ SOLN
1000.0000 ug | Freq: Every day | INTRAMUSCULAR | Status: AC
  Administered 2020-10-17 – 2020-10-23 (×6): 1000 ug via INTRAMUSCULAR
  Filled 2020-10-17 (×7): qty 1

## 2020-10-17 MED ORDER — HYDRALAZINE HCL 20 MG/ML IJ SOLN: 10.0000 mg | INTRAMUSCULAR | Status: DC | PRN

## 2020-10-17 MED ORDER — ACETAMINOPHEN 325 MG PO TABS
650.0000 mg | ORAL_TABLET | Freq: Once | ORAL | Status: AC
  Administered 2020-10-17: 650 mg via ORAL
  Filled 2020-10-17: qty 2

## 2020-10-17 MED ORDER — PHENYTOIN SODIUM EXTENDED 100 MG PO CAPS
300.0000 mg | ORAL_CAPSULE | Freq: Every day | ORAL | Status: DC
  Administered 2020-10-17 – 2020-10-27 (×11): 300 mg via ORAL
  Filled 2020-10-17 (×11): qty 3

## 2020-10-17 MED ORDER — HYDROCODONE-ACETAMINOPHEN 5-325 MG PO TABS
1.0000 | ORAL_TABLET | Freq: Four times a day (QID) | ORAL | Status: DC | PRN
  Administered 2020-10-17 – 2020-10-25 (×4): 1 via ORAL
  Filled 2020-10-17 (×5): qty 1

## 2020-10-17 MED ORDER — MORPHINE SULFATE (PF) 2 MG/ML IV SOLN: 0.5000 mg | INTRAVENOUS | Status: DC | PRN

## 2020-10-17 MED ORDER — PANTOPRAZOLE SODIUM 40 MG PO TBEC
40.0000 mg | DELAYED_RELEASE_TABLET | Freq: Every day | ORAL | Status: DC
  Administered 2020-10-18 – 2020-10-28 (×9): 40 mg via ORAL
  Filled 2020-10-17 (×10): qty 1

## 2020-10-17 MED ORDER — SODIUM CHLORIDE 0.9% IV SOLUTION: Freq: Once | INTRAVENOUS | Status: DC

## 2020-10-17 MED ORDER — HYDROMORPHONE HCL 1 MG/ML IJ SOLN
1.0000 mg | Freq: Once | INTRAMUSCULAR | Status: DC
  Filled 2020-10-17: qty 1

## 2020-10-17 MED ORDER — CYANOCOBALAMIN 1000 MCG/ML IJ SOLN
1000.0000 ug | INTRAMUSCULAR | Status: DC
  Administered 2020-10-24: 1000 ug via INTRAMUSCULAR
  Filled 2020-10-17: qty 1

## 2020-10-17 MED ORDER — ASPIRIN EC 81 MG PO TBEC: 81.0000 mg | DELAYED_RELEASE_TABLET | Freq: Every day | ORAL | Status: DC

## 2020-10-17 MED ORDER — LATANOPROST 0.005 % OP SOLN
1.0000 [drp] | Freq: Every day | OPHTHALMIC | Status: DC
  Administered 2020-10-17 – 2020-10-27 (×11): 1 [drp] via OPHTHALMIC
  Filled 2020-10-17 (×2): qty 2.5

## 2020-10-17 MED ORDER — HEPARIN SODIUM (PORCINE) 5000 UNIT/ML IJ SOLN
5000.0000 [IU] | Freq: Three times a day (TID) | INTRAMUSCULAR | Status: DC
  Administered 2020-10-17 – 2020-10-19 (×5): 5000 [IU] via SUBCUTANEOUS
  Filled 2020-10-17 (×6): qty 1

## 2020-10-17 NOTE — ED Notes (Signed)
Patient transported to CT 
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
## 2020-10-17 NOTE — ED Notes (Signed)
Attempted to call report

## 2020-10-17 NOTE — Progress Notes (Signed)
Orthopedic Tech Progress Note Patient Details:  Aaron [name] [birth year] [id number]  Musculoskeletal Traction Type of Traction: Bucks Skin Traction Traction Location: rle Traction Weight: 5 lbs   Post Interventions Patient Tolerated: Well Instructions Provided: Care of device, Adjustment of device  Trinna Post 10/17/2020, 1:46 AM

## 2020-10-17 NOTE — ED Notes (Signed)
Report given to Jordan B, RN  

## 2020-10-17 NOTE — ED Provider Notes (Signed)
Care assumed from [facility].  Patient with mechanical fall in the right hip.  Sustained periprosthetic right femur fracture.  Did not hit head or lose consciousness.  Awaiting labs prior to medical admission.  Dr. Aundria Rud aware of patient and will consult with surgery anticipated on Monday at the earliest.  Buck's traction will be placed.  Patient requesting his home Dilantin.  Denies head injury.  Denies blood thinner use  Screening labs unremarkable.  Admission discussed with Dr. Toniann Fail.   Glynn Octave, MD 10/17/20 [phone]
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
## 2020-10-17 NOTE — TOC Initial Note (Signed)
Transition of Care [facility]) - Initial/Assessment Note    Patient Details  Name: Aaron [name] MRN: [id number] Date of Birth: 01-19-1941  [facility] CM/SW Contact:    Lockie Pares, RN Phone Number: 10/17/2020, 12:16 PM  Clinical Narrative:                  Patient in for a fall, had previous hip fhip replacemnet, that is now fractured, plan for surgery on 9/12. Had 2 Units of PRBCs today for low HBG to optimize H&H for surgery.  PT will evaluate post- op for recommendations. Ay need SNF short rehab.  CM/CSW weill follow for needs.   Expected Discharge Plan: Skilled Nursing Facility Barriers to Discharge: Continued Medical Work up   Patient Goals and CMS Choice        Expected Discharge Plan and Services Expected Discharge Plan: Skilled Nursing Facility In-house Referral: Clinical Social Work Discharge Planning Services: CM Consult   Living arrangements for the past 2 months: Single Family Home                                      Prior Living Arrangements/Services Living arrangements for the past 2 months: Single Family Home   Patient language and need for interpreter reviewed:: Yes        Need for Family Participation in Patient Care: Yes (Comment) Care giver support system in place?: Yes (comment)   Criminal Activity/Legal Involvement Pertinent to Current Situation/Hospitalization: No - Comment as needed  Activities of Daily Living      Permission Sought/Granted                  Emotional Assessment       Orientation: : Oriented to Self, Oriented to Place, Oriented to  Time, Oriented to Situation Alcohol / Substance Use: Not Applicable Psych Involvement: No (comment)  Admission diagnosis:  Periprosthetic hip fracture, initial encounter [[id number], Z96.649] Patient Active Problem List   Diagnosis Date Noted   Periprosthetic hip fracture, initial encounter 10/17/2020   Normochromic normocytic anemia 10/17/2020    History of seizure 10/17/2020   Essential hypertension 10/17/2020   Closed bent bone fracture of left ulna 05/09/2017   PCP:  Eartha Inch, MD Pharmacy:   [facility] [address], [place] - [address] [facility] [place] [address] Phone: [phone] Fax: [phone]     Social Determinants of Health (SDOH) Interventions    Readmission Risk Interventions No flowsheet data found.

## 2020-10-17 NOTE — Plan of Care (Signed)
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
## 2020-10-17 NOTE — ED Notes (Signed)
Tele  Breakfast Ordered 

## 2020-10-17 NOTE — Progress Notes (Signed)
PROGRESS NOTE    Aaron [name]  TSV:[id number] DOB: [birth year] DOA: 10/16/2020 PCP: Eartha Inch, MD   Chief Complaint  Patient presents with   Fall    Brief Narrative:    This is a no charge note as patient was seen and admitted earlier today by Dr. Toniann Fail, chart, imaging, labs were reviewed, patient was seen and examined.  HPI: Aaron [name] is a 80 y.o. male with history of hypertension and seizures was brought to the ER after patient had a fall at home.  Patient states he was turning when he suddenly slipped and fell did not hit his head or lose consciousness.  Was experiencing pain in the right hip.   ED Course: X-rays of the hip shows a right periprosthetic fracture.  Orthopedic surgeon on-call Dr. Aundria Rud was consulted plan is to have surgery done on October 18, 2020.  Labs show mild leukocytosis and hemoglobin has decreased from around 11 in 2019 it is around 9.5.  COVID test was negative.  EKG is pending.    Assessment & Plan:   Principal Problem:   Periprosthetic hip fracture, initial encounter Active Problems:   Normochromic normocytic anemia   History of seizure   Essential hypertension   Right hip periprosthetic femur diaphyseal femur fracture -Agement per orthopedic, plan for right femur CT to evaluate the prosthesis for surgical planning, surgery this week by Dr. Linna Caprice, final timing per orthopedic.  -Hold aspirin, continue subcu heparin per Ortho.  Acute blood loss anemia -Received 2 units per Ortho today for presurgical stabilization -B12 level significantly low as well, started on supplements.  To continue on discharge  B12 deficiency -Started on supplements  History of hypertension was hypotensive at presentation.   -Blood pressure soft acceptable, well hold lisinopril, will continue with as needed hydralazine as well  History of seizures on Dilantin 300 mg at bedtime.  Hyperlipidemia -Continue with statin     DVT  prophylaxis: [place] heparin Code Status: Full Family Communication: None at bedside Disposition:   Status is: Inpatient  Remains inpatient appropriate because:Ongoing diagnostic testing needed not appropriate for outpatient work up  Dispo: The patient is from: Home              Anticipated d/c is to: SNF              Patient currently is not medically stable to d/c.   Difficult to place patient No       Consultants:  ortho   Subjective:  Reports pain at right hip  Objective: Vitals:   10/17/20 1045 10/17/20 1214 10/17/20 1230 10/17/20 1235  BP: 115/68 (!) 131/58 (!) 126/57 (!) 126/57  Pulse: 74 73 66 66  Resp: 14 12 11 11   Temp:  98.6 F (37 C)  98.5 F (36.9 C)  TempSrc:  Oral  Oral  SpO2: 97%  99%     Intake/Output Summary (Last 24 hours) at 10/17/2020 1308 Last data filed at 10/17/2020 1128 Gross per 24 hour  Intake 1000 ml  Output 850 ml  Net 150 ml   There were no vitals filed for this visit.  Examination:   Awake Alert, Oriented X 3, No new F.N deficits, Normal affect,  Symmetrical Chest wall movement,  Right lower extremity in traction    Data Reviewed: I have personally reviewed following labs and imaging studies  CBC: Recent Labs  Lab 10/16/20 2323 10/17/20 0304  WBC 14.0* 8.2  NEUTROABS 12.4*  --   HGB 9.5*  8.7*  HCT 29.6* 26.5*  MCV 86.3 84.1  PLT 228 204    Basic Metabolic Panel: Recent Labs  Lab 10/16/20 2323 10/17/20 0304  NA 135 131*  K 3.9 4.2  CL 101 103  CO2 22 23  GLUCOSE 105* 137*  BUN 12 11  CREATININE 0.66 0.72  CALCIUM 8.1* 8.0*    GFR: CrCl cannot be calculated (Unknown ideal weight.).  Liver Function Tests: Recent Labs  Lab 10/16/20 2323  AST 22  ALT 17  ALKPHOS 112  BILITOT 0.6  PROT 4.4*  ALBUMIN 3.2*    CBG: No results for input(s): GLUCAP in the last 168 hours.   Recent Results (from the past 240 hour(s))  Resp Panel by RT-PCR (Flu A&B, Covid) Nasopharyngeal Swab     Status: None    Collection Time: 10/16/20 11:30 PM   Specimen: Nasopharyngeal Swab; Nasopharyngeal(NP) swabs in vial transport medium  Result Value Ref Range Status   SARS Coronavirus 2 by RT PCR NEGATIVE NEGATIVE Final    Comment: (NOTE) SARS-CoV-2 target nucleic acids are NOT DETECTED.  The SARS-CoV-2 RNA is generally detectable in upper respiratory specimens during the acute phase of infection. The lowest concentration of SARS-CoV-2 viral copies this assay can detect is 138 copies/mL. A negative result does not preclude SARS-Cov-2 infection and should not be used as the sole basis for treatment or other patient management decisions. A negative result may occur with  improper specimen collection/handling, submission of specimen other than nasopharyngeal swab, presence of viral mutation(s) within the areas targeted by this assay, and inadequate number of viral copies(<138 copies/mL). A negative result must be combined with clinical observations, patient history, and epidemiological information. The expected result is Negative.  Fact Sheet for Patients:  BloggerCourse.comhttps://www.fda.gov/media/152166/download  Fact Sheet for Healthcare Providers:  SeriousBroker.ithttps://www.fda.gov/media/152162/download  This test is no t yet approved or cleared by the [place] FDA and  has been authorized for detection and/or diagnosis of SARS-CoV-2 by FDA under an Emergency Use Authorization (EUA). This EUA will remain  in effect (meaning this test can be used) for the duration of the COVID-19 declaration under Section 564(b)(1) of the Act, 21 U.S.C.section 360bbb-3(b)(1), unless the authorization is terminated  or revoked sooner.       Influenza A by PCR NEGATIVE NEGATIVE Final   Influenza B by PCR NEGATIVE NEGATIVE Final    Comment: (NOTE) The Xpert Xpress SARS-CoV-2/FLU/RSV plus assay is intended as an aid in the diagnosis of influenza from Nasopharyngeal swab specimens and should not be used as a sole basis for treatment.  Nasal washings and aspirates are unacceptable for Xpert Xpress SARS-CoV-2/FLU/RSV testing.  Fact Sheet for Patients: BloggerCourse.comhttps://www.fda.gov/media/152166/download  Fact Sheet for Healthcare Providers: SeriousBroker.ithttps://www.fda.gov/media/152162/download  This test is not yet approved or cleared by the [place] FDA and has been authorized for detection and/or diagnosis of SARS-CoV-2 by FDA under an Emergency Use Authorization (EUA). This EUA will remain in effect (meaning this test can be used) for the duration of the COVID-19 declaration under Section 564(b)(1) of the Act, 21 U.S.C. section 360bbb-3(b)(1), unless the authorization is terminated or revoked.  Performed at [facility], [address] [place], [place] [address]          Radiology Studies: DG Pelvis 1-2 Views  Result Date: 10/16/2020 CLINICAL DATA:  Larey SeatFell, deformity EXAM: PELVIS - 1-2 VIEW COMPARISON:  None. FINDINGS: Single frontal view of the pelvis was obtained. The periprosthetic right femoral diaphyseal fracture seen on corresponding femur examination is partially  visualized on this study. The acetabular component of the right hip arthroplasty is unremarkable. The bones are diffusely osteopenic. 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 Posterior fusion is identified at the lumbosacral junction. IMPRESSION: 1. Partial visualization of the proximal right femoral periprosthetic fracture. Please refer to dedicated right femur x-rays. 2. Diffuse osteopenia. Electronically Signed   By: Sharlet Salina M.D.   On: 10/16/2020 22:28   CT FEMUR RIGHT WO CONTRAST  Result Date: 10/17/2020 CLINICAL DATA:  Right periprosthetic femur fracture. EXAM: CT OF THE LOWER RIGHT EXTREMITY WITHOUT CONTRAST TECHNIQUE: Multidetector CT imaging of the right lower extremity was performed according to the standard protocol. COMPARISON:  Right femur x-rays from yesterday. FINDINGS: Bones/Joint/Cartilage Acute oblique fracture of the right femoral  diaphysis just inferior to the femoral component of the right hip arthroplasty. 2.1 cm lateral displacement. Up to 2.9 cm of distraction. 3.0 cm obliquely oriented fracture fragment between the dominant fracture fragments (series 3, image 81). Acute nondisplaced fracture of the right inferior pubic ramus. No dislocation. The knee is unremarkable. No joint effusion. Osteopenia. Ligaments Ligaments are suboptimally evaluated by CT. Muscles and Tendons Grossly intact. Intramuscular edema and hemorrhage surrounding the fracture. Soft tissue No fluid collection or hematoma.  No soft tissue mass. IMPRESSION: 1. Acute oblique periprosthetic fracture of the right femoral diaphysis just inferior to the femoral component of the right hip arthroplasty as described above. 2. Acute nondisplaced fracture of the right inferior pubic ramus. Electronically Signed   By: Obie Dredge M.D.   On: 10/17/2020 12:34   DG CHEST PORT 1 VIEW  Result Date: 10/17/2020 CLINICAL DATA:  Preop chest radiograph. EXAM: PORTABLE CHEST 1 VIEW COMPARISON:  Chest radiograph dated 04/18/2018. FINDINGS: Diffuse chronic interstitial coarsening. No focal consolidation, pleural effusion, or pneumothorax. Top-normal cardiac size. Atherosclerotic calcification of the aorta. Osteopenia with degenerative changes of the spine. Partially visualized lumbar fusion hardware. No acute osseous pathology. IMPRESSION: No active disease. Electronically Signed   By: Elgie Collard M.D.   On: 10/17/2020 03:18   DG Femur Min 2 Views Right  Result Date: 10/16/2020 CLINICAL DATA:  Fall, deformity EXAM: RIGHT FEMUR 2 VIEWS COMPARISON:  None. FINDINGS: Frontal and lateral views of the right femur are obtained. A right hip arthroplasty is identified. An oblique comminuted displaced periprosthetic fracture is seen along the distal margin of the femoral component of the hip arthroplasty, involving the proximal right femoral diaphysis. There is significant valgus  angulation and lateral displacement of the distal fracture fragment. No other acute displaced fractures are identified. The bones are severely osteopenic. IMPRESSION: 1. Comminuted displaced periprosthetic proximal right femoral diaphyseal fracture along the distal margin of the femoral component of the right hip arthroplasty. Significant valgus angulation and lateral displacement of the distal fracture fragment. 2. Diffuse osteopenia. Electronically Signed   By: Sharlet Salina M.D.   On: 10/16/2020 22:27        Scheduled Meds:  sodium chloride   Intravenous Once   [START ON 10/18/2020] aspirin EC  81 mg Oral Daily   atorvastatin  80 mg Oral Daily   Calcium-Magnesium-Zinc   Oral Daily   [place] Senior  1 tablet Oral Daily   cyanocobalamin  1,000 mcg Intramuscular Daily   Followed by   Melene Muller ON 10/24/2020] cyanocobalamin  1,000 mcg Intramuscular Weekly   heparin injection (subcutaneous)  5,000 Units Subcutaneous Q8H   latanoprost  1 drop Both Eyes QHS   pantoprazole  40 mg Oral Daily   phenytoin  300 mg Oral  QHS   Continuous Infusions:   LOS: 0 days       Huey Bienenstock, MD [facility]   To contact the attending provider between 7A-7P or the covering provider during after hours 7P-7A, please log into the web site [email or web address] and access using universal [place] password for that web site. If you do not have the password, please call the hospital operator.  10/17/2020, 1:08 PM

## 2020-10-18 ENCOUNTER — Inpatient Hospital Stay (HOSPITAL_COMMUNITY): Payer: Medicare Other

## 2020-10-18 DIAGNOSIS — I10 Essential (primary) hypertension: Secondary | ICD-10-CM

## 2020-10-18 DIAGNOSIS — D649 Anemia, unspecified: Secondary | ICD-10-CM

## 2020-10-18 DIAGNOSIS — M978XXA Periprosthetic fracture around other internal prosthetic joint, initial encounter: Secondary | ICD-10-CM | POA: Diagnosis not present

## 2020-10-18 DIAGNOSIS — Z87898 Personal history of other specified conditions: Secondary | ICD-10-CM

## 2020-10-18 LAB — BPAM RBC
Blood Product Expiration Date: 202210052359
Blood Product Expiration Date: 202210052359
ISSUE DATE / TIME: 202209111134
ISSUE DATE / TIME: 202209111621
Unit Type and Rh: 6200
Unit Type and Rh: 6200

## 2020-10-18 LAB — TYPE AND SCREEN
ABO/RH(D): AB POS
Antibody Screen: NEGATIVE
Unit division: 0
Unit division: 0

## 2020-10-18 LAB — HEMOGLOBIN AND HEMATOCRIT, BLOOD
HCT: 34.6 % — ABNORMAL LOW (ref 39.0–52.0)
Hemoglobin: 12 g/dL — ABNORMAL LOW (ref 13.0–17.0)

## 2020-10-18 IMAGING — DX DG SHOULDER 2+V PORT*R*
1 series · 2 of 2 positions shown · non-contrast
Comparison: None.

CLINICAL DATA: Proximal right humeral pain

EXAM:
PORTABLE RIGHT SHOULDER

[Series 1: shoulder · 0.14mm/px · 2 of 2 slices shown]
[im 1/2]
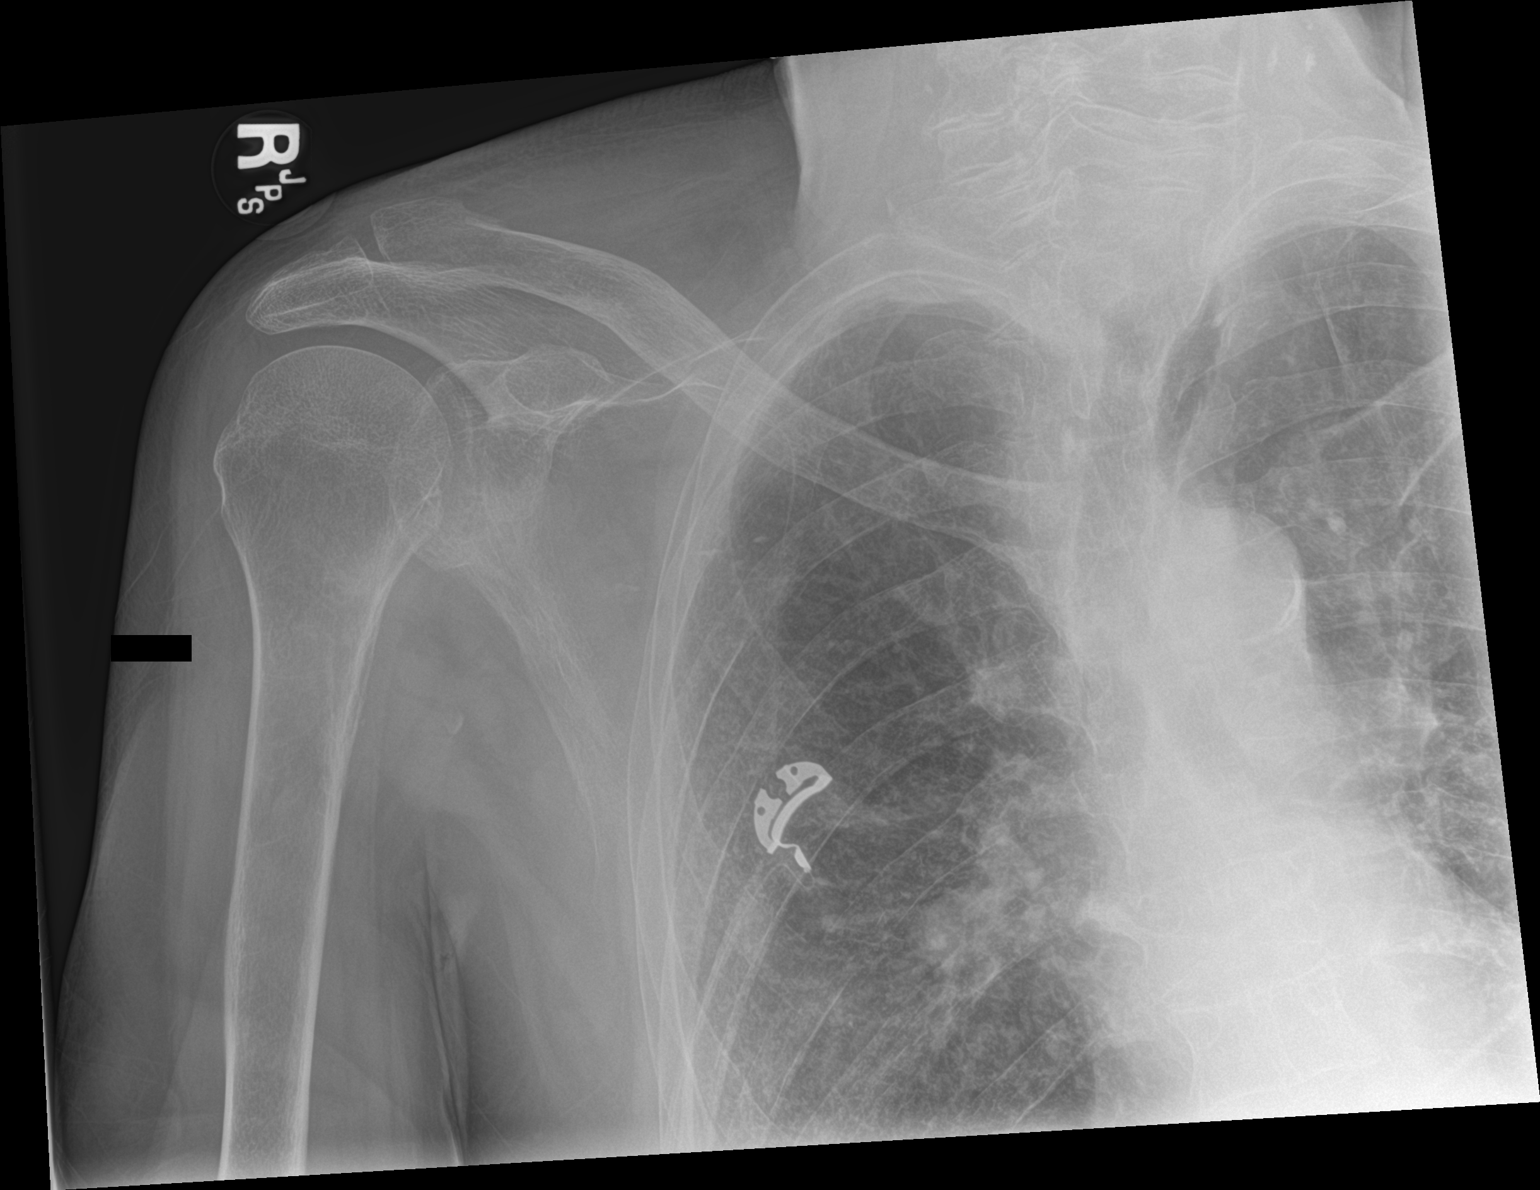
[im 2/2]
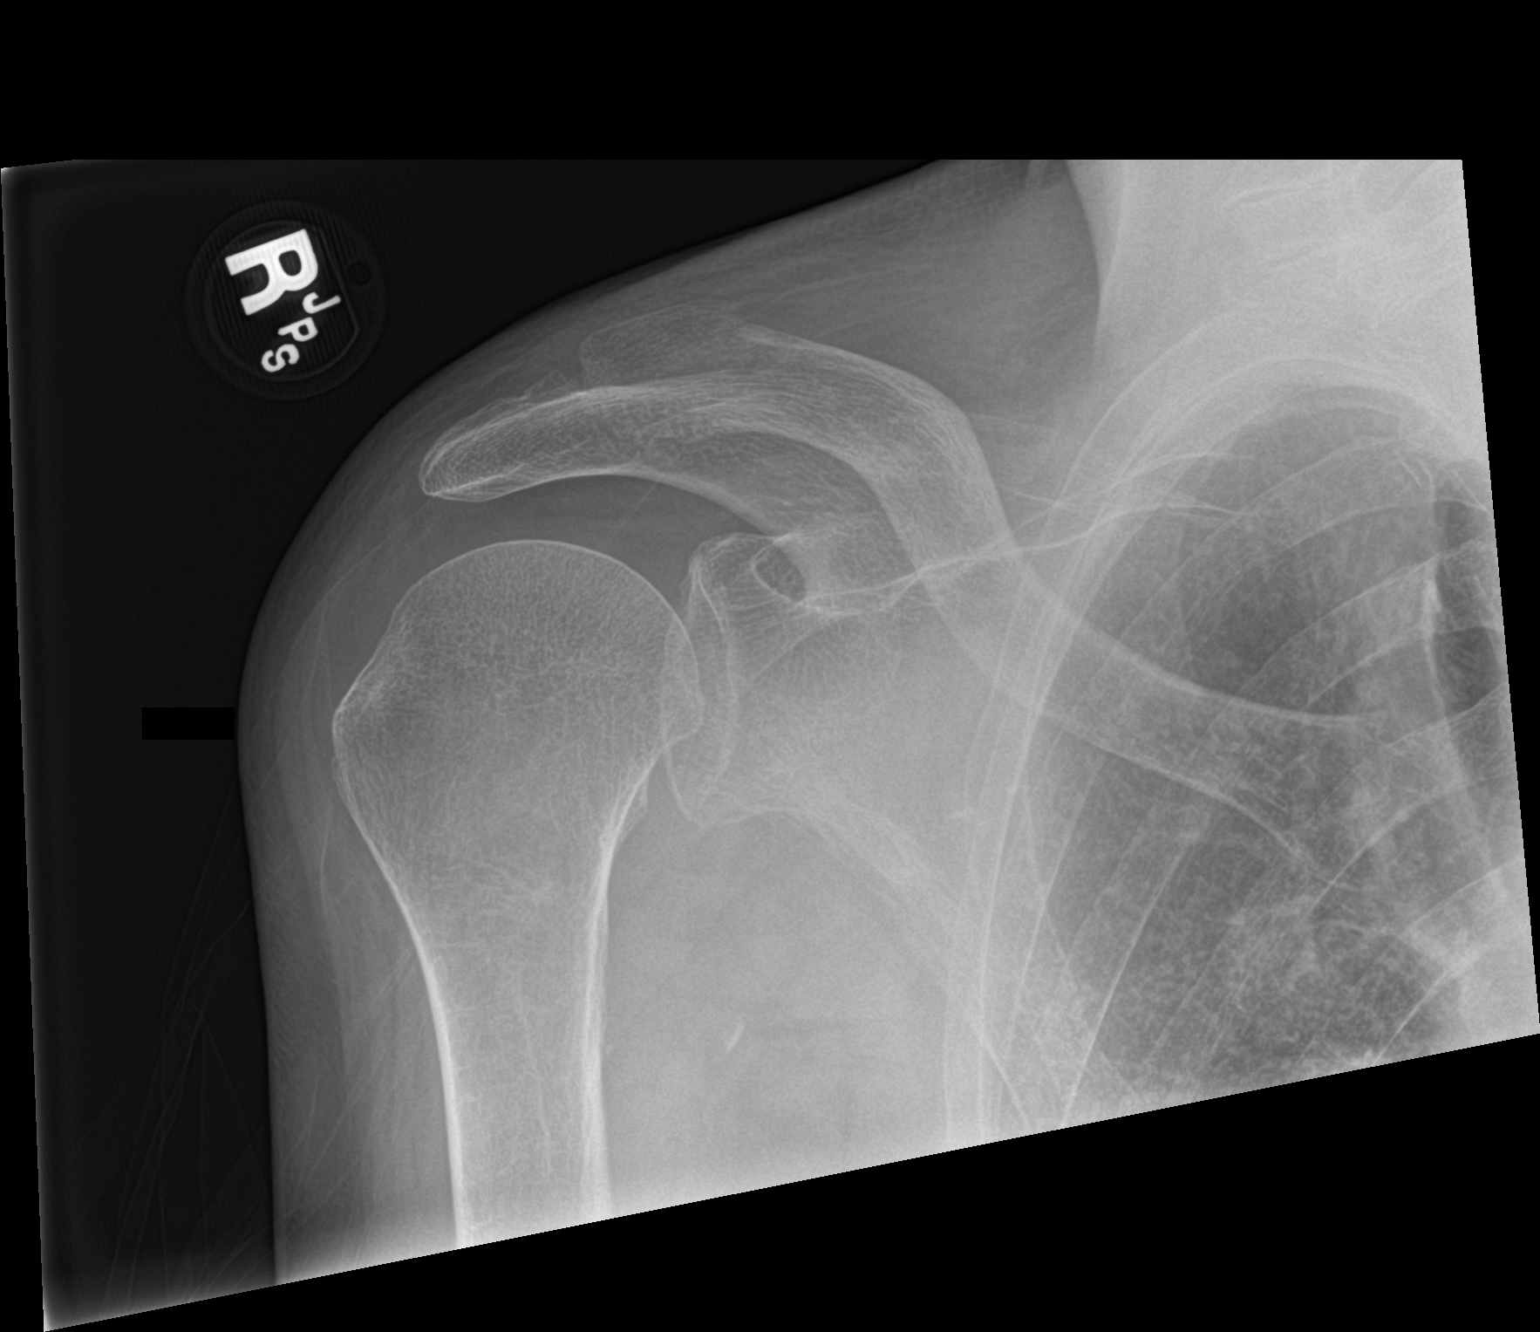

[2 of 2 positions shown; findings below may reference images not displayed]

FINDINGS: There is no evidence of fracture or dislocation. There is no
evidence of arthropathy or other focal bone abnormality. Soft
tissues are unremarkable.
IMPRESSION: Negative.

## 2020-10-18 MED ORDER — SENNOSIDES-DOCUSATE SODIUM 8.6-50 MG PO TABS
2.0000 | ORAL_TABLET | Freq: Two times a day (BID) | ORAL | Status: DC
  Administered 2020-10-18 – 2020-10-19 (×2): 2 via ORAL
  Filled 2020-10-18 (×2): qty 2

## 2020-10-18 MED ORDER — POLYETHYLENE GLYCOL 3350 17 G PO PACK: 17.0000 g | PACK | Freq: Every day | ORAL | Status: DC | PRN

## 2020-10-18 MED ORDER — ADULT MULTIVITAMIN W/MINERALS CH
1.0000 | ORAL_TABLET | Freq: Every day | ORAL | Status: DC
  Administered 2020-10-18 – 2020-10-28 (×7): 1 via ORAL
  Filled 2020-10-18 (×8): qty 1

## 2020-10-18 MED ORDER — ENSURE ENLIVE PO LIQD
237.0000 mL | Freq: Two times a day (BID) | ORAL | Status: DC
  Administered 2020-10-19 – 2020-10-28 (×12): 237 mL via ORAL

## 2020-10-18 NOTE — TOC CAGE-AID Note (Signed)
Transition of Care [facility]) - CAGE-AID Screening   Patient Details  Name: Aaron [name] MRN: [id number] Date of Birth: 1940-12-15  Transition of Care [facility]) CM/SW Contact:    Tomoki Lucken C Tarpley-Carter, LCSWA Phone Number: 10/18/2020, 9:56 AM   Clinical Narrative: Pt is unable to participate in Cage Aid.  Pt is inappropriate for assessment.  Kashlyn Salinas Tarpley-Carter, MSW, LCSW-A Pronouns:  She/Her/Hers [facility] Clinical Social Worker Direct Number:  [phone] [email or web address]  CAGE-AID Screening: Substance Abuse Screening unable to be completed due to: : Patient unable to participate (Pt is inappropriate for assessment.)
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
## 2020-10-18 NOTE — Progress Notes (Signed)
PROGRESS NOTE    Aaron [name]  EXB:[id number] DOB: 1940-05-22 DOA: 10/16/2020 PCP: Eartha Inch, MD   Chief Complaint  Patient presents with   Fall    Brief Narrative:    Aaron [name] is a 80 y.o. male with history of hypertension and seizures was brought to the ER after patient had a fall at home.  Patient states he was turning when he suddenly slipped and fell did not hit his head or lose consciousness.  Was experiencing pain in the right hip.   ED Course: X-rays of the hip shows a right periprosthetic fracture.  Orthopedic surgeon on-call Dr. Aundria Rud was consulted plan is to have surgery done on October 18, 2020.  Labs show mild leukocytosis and hemoglobin has decreased from around 11 in 2019 it is around 9.5.  COVID test was negative.  EKG is pending.    Assessment & Plan:   Principal Problem:   Periprosthetic hip fracture, initial encounter Active Problems:   Normochromic normocytic anemia   History of seizure   Essential hypertension   Right hip periprosthetic femur diaphyseal femur fracture -Mangement per orthopedic. -Subcu heparin for DVT prophylaxis, holding aspirin per Ortho recommendation. -Discussed with Dr. Linna Caprice today, plan for surgical repair Wednesday morning at [facility], so we will arrange for transfer tomorrow to [facility].  -Hold aspirin, continue subcu heparin per Ortho. -Patient reports right shoulder pain, will obtain shoulder x-ray.  Acute blood loss anemia -Received 2 units per Ortho admission, hemoglobin is stable at 12 today. -B12 level significantly low as well, started on supplements.  To continue on discharge  B12 deficiency -Started on supplements  History of hypertension was hypotensive at presentation.   -Blood pressure soft acceptable, well hold lisinopril, will continue with as needed hydralazine as well  History of seizures on Dilantin 300 mg at bedtime.  Hyperlipidemia -Continue with  statin     DVT prophylaxis: [place] heparin Code Status: Full Family Communication: None at bedside Disposition:   Status is: Inpatient  Remains inpatient appropriate because:Ongoing diagnostic testing needed not appropriate for outpatient work up  Dispo: The patient is from: Home              Anticipated d/c is to: SNF              Patient currently is not medically stable to d/c.   Difficult to place patient No       Consultants:  ortho   Subjective:  Ports his right hip pain is controlled with current regimen, no nausea, no vomiting, he does report no bowel movement yet. -Does report improvement of right shoulder pain  Objective: Vitals:   10/17/20 1633 10/17/20 1652 10/17/20 1927 10/18/20 0855  BP: 133/71 (!) 138/57 133/63 (!) 156/84  Pulse: 82 78 72 70  Resp: 16 16 16 16   Temp: 98.2 F (36.8 C) 98.3 F (36.8 C) 98.1 F (36.7 C) 98.6 F (37 C)  TempSrc: Oral  Oral Oral  SpO2: 98%  98% 100%    Intake/Output Summary (Last 24 hours) at 10/18/2020 1205 Last data filed at 10/18/2020 0700 Gross per 24 hour  Intake 704.5 ml  Output 1700 ml  Net -995.5 ml   There were no vitals filed for this visit.  Examination:   Awake Alert, Oriented X 3, No new F.N deficits, Normal affect Symmetrical Chest wall movement, Good air movement bilaterally, CTAB RRR,No Gallops,Rubs or new Murmurs, No Parasternal Heave +ve B.Sounds, Abd Soft, No tenderness, No  rebound - guarding or rigidity. No Cyanosis, Clubbing or edema, right lower extremity in traction.      Data Reviewed: I have personally reviewed following labs and imaging studies  CBC: Recent Labs  Lab 10/16/20 2323 10/17/20 0304 10/18/20 0208  WBC 14.0* 8.2  --   NEUTROABS 12.4*  --   --   HGB 9.5* 8.7* 12.0*  HCT 29.6* 26.5* 34.6*  MCV 86.3 84.1  --   PLT 228 204  --     Basic Metabolic Panel: Recent Labs  Lab 10/16/20 2323 10/17/20 0304  NA 135 131*  K 3.9 4.2  CL 101 103  CO2 22 23  GLUCOSE  105* 137*  BUN 12 11  CREATININE 0.66 0.72  CALCIUM 8.1* 8.0*    GFR: CrCl cannot be calculated (Unknown ideal weight.).  Liver Function Tests: Recent Labs  Lab 10/16/20 2323  AST 22  ALT 17  ALKPHOS 112  BILITOT 0.6  PROT 4.4*  ALBUMIN 3.2*    CBG: No results for input(s): GLUCAP in the last 168 hours.   Recent Results (from the past 240 hour(s))  Resp Panel by RT-PCR (Flu A&B, Covid) Nasopharyngeal Swab     Status: None   Collection Time: 10/16/20 11:30 PM   Specimen: Nasopharyngeal Swab; Nasopharyngeal(NP) swabs in vial transport medium  Result Value Ref Range Status   SARS Coronavirus 2 by RT PCR NEGATIVE NEGATIVE Final    Comment: (NOTE) SARS-CoV-2 target nucleic acids are NOT DETECTED.  The SARS-CoV-2 RNA is generally detectable in upper respiratory specimens during the acute phase of infection. The lowest concentration of SARS-CoV-2 viral copies this assay can detect is 138 copies/mL. A negative result does not preclude SARS-Cov-2 infection and should not be used as the sole basis for treatment or other patient management decisions. A negative result may occur with  improper specimen collection/handling, submission of specimen other than nasopharyngeal swab, presence of viral mutation(s) within the areas targeted by this assay, and inadequate number of viral copies(<138 copies/mL). A negative result must be combined with clinical observations, patient history, and epidemiological information. The expected result is Negative.  Fact Sheet for Patients:  BloggerCourse.comhttps://www.fda.gov/media/152166/download  Fact Sheet for Healthcare Providers:  SeriousBroker.ithttps://www.fda.gov/media/152162/download  This test is no t yet approved or cleared by the [place] FDA and  has been authorized for detection and/or diagnosis of SARS-CoV-2 by FDA under an Emergency Use Authorization (EUA). This EUA will remain  in effect (meaning this test can be used) for the duration of the COVID-19  declaration under Section 564(b)(1) of the Act, 21 U.S.C.section 360bbb-3(b)(1), unless the authorization is terminated  or revoked sooner.       Influenza A by PCR NEGATIVE NEGATIVE Final   Influenza B by PCR NEGATIVE NEGATIVE Final    Comment: (NOTE) The Xpert Xpress SARS-CoV-2/FLU/RSV plus assay is intended as an aid in the diagnosis of influenza from Nasopharyngeal swab specimens and should not be used as a sole basis for treatment. Nasal washings and aspirates are unacceptable for Xpert Xpress SARS-CoV-2/FLU/RSV testing.  Fact Sheet for Patients: BloggerCourse.comhttps://www.fda.gov/media/152166/download  Fact Sheet for Healthcare Providers: SeriousBroker.ithttps://www.fda.gov/media/152162/download  This test is not yet approved or cleared by the [place] FDA and has been authorized for detection and/or diagnosis of SARS-CoV-2 by FDA under an Emergency Use Authorization (EUA). This EUA will remain in effect (meaning this test can be used) for the duration of the COVID-19 declaration under Section 564(b)(1) of the Act, 21 U.S.C. section 360bbb-3(b)(1), unless the authorization is terminated  or revoked.  Performed at [facility], [address], [place], [place] [address]          Radiology Studies: DG Pelvis 1-2 Views  Result Date: 10/16/2020 CLINICAL DATA:  Larey Seat, deformity EXAM: PELVIS - 1-2 VIEW COMPARISON:  None. FINDINGS: Single frontal view of the pelvis was obtained. The periprosthetic right femoral diaphyseal fracture seen on corresponding femur examination is partially visualized on this study. The acetabular component of the right hip arthroplasty is unremarkable. The bones are diffusely osteopenic. 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 Posterior fusion is identified at the lumbosacral junction. IMPRESSION: 1. Partial visualization of the proximal right femoral periprosthetic fracture. Please refer to dedicated right femur x-rays. 2. Diffuse osteopenia. Electronically Signed   By:  Sharlet Salina M.D.   On: 10/16/2020 22:28   CT FEMUR RIGHT WO CONTRAST  Result Date: 10/17/2020 CLINICAL DATA:  Right periprosthetic femur fracture. EXAM: CT OF THE LOWER RIGHT EXTREMITY WITHOUT CONTRAST TECHNIQUE: Multidetector CT imaging of the right lower extremity was performed according to the standard protocol. COMPARISON:  Right femur x-rays from yesterday. FINDINGS: Bones/Joint/Cartilage Acute oblique fracture of the right femoral diaphysis just inferior to the femoral component of the right hip arthroplasty. 2.1 cm lateral displacement. Up to 2.9 cm of distraction. 3.0 cm obliquely oriented fracture fragment between the dominant fracture fragments (series 3, image 81). Acute nondisplaced fracture of the right inferior pubic ramus. No dislocation. The knee is unremarkable. No joint effusion. Osteopenia. Ligaments Ligaments are suboptimally evaluated by CT. Muscles and Tendons Grossly intact. Intramuscular edema and hemorrhage surrounding the fracture. Soft tissue No fluid collection or hematoma.  No soft tissue mass. IMPRESSION: 1. Acute oblique periprosthetic fracture of the right femoral diaphysis just inferior to the femoral component of the right hip arthroplasty as described above. 2. Acute nondisplaced fracture of the right inferior pubic ramus. Electronically Signed   By: Obie Dredge M.D.   On: 10/17/2020 12:34   DG CHEST PORT 1 VIEW  Result Date: 10/17/2020 CLINICAL DATA:  Preop chest radiograph. EXAM: PORTABLE CHEST 1 VIEW COMPARISON:  Chest radiograph dated 04/18/2018. FINDINGS: Diffuse chronic interstitial coarsening. No focal consolidation, pleural effusion, or pneumothorax. Top-normal cardiac size. Atherosclerotic calcification of the aorta. Osteopenia with degenerative changes of the spine. Partially visualized lumbar fusion hardware. No acute osseous pathology. IMPRESSION: No active disease. Electronically Signed   By: Elgie Collard M.D.   On: 10/17/2020 03:18   DG Femur Min  2 Views Right  Result Date: 10/16/2020 CLINICAL DATA:  Fall, deformity EXAM: RIGHT FEMUR 2 VIEWS COMPARISON:  None. FINDINGS: Frontal and lateral views of the right femur are obtained. A right hip arthroplasty is identified. An oblique comminuted displaced periprosthetic fracture is seen along the distal margin of the femoral component of the hip arthroplasty, involving the proximal right femoral diaphysis. There is significant valgus angulation and lateral displacement of the distal fracture fragment. No other acute displaced fractures are identified. The bones are severely osteopenic. IMPRESSION: 1. Comminuted displaced periprosthetic proximal right femoral diaphyseal fracture along the distal margin of the femoral component of the right hip arthroplasty. Significant valgus angulation and lateral displacement of the distal fracture fragment. 2. Diffuse osteopenia. Electronically Signed   By: Sharlet Salina M.D.   On: 10/16/2020 22:27        Scheduled Meds:  sodium chloride   Intravenous Once   atorvastatin  80 mg Oral Daily   cyanocobalamin  1,000 mcg Intramuscular Daily   Followed by   Melene Muller  ON 10/24/2020] cyanocobalamin  1,000 mcg Intramuscular Weekly   heparin injection (subcutaneous)  5,000 Units Subcutaneous Q8H   latanoprost  1 drop Both Eyes QHS   pantoprazole  40 mg Oral Daily   phenytoin  300 mg Oral QHS   Continuous Infusions:   LOS: 1 day       Huey Bienenstock, MD [facility]   To contact the attending provider between 7A-7P or the covering provider during after hours 7P-7A, please log into the web site [email or web address] and access using universal [place] password for that web site. If you do not have the password, please call the hospital operator.  10/18/2020, 12:05 PM

## 2020-10-19 DIAGNOSIS — M978XXA Periprosthetic fracture around other internal prosthetic joint, initial encounter: Secondary | ICD-10-CM | POA: Diagnosis not present

## 2020-10-19 DIAGNOSIS — E440 Moderate protein-calorie malnutrition: Secondary | ICD-10-CM | POA: Insufficient documentation

## 2020-10-19 DIAGNOSIS — I10 Essential (primary) hypertension: Secondary | ICD-10-CM | POA: Diagnosis not present

## 2020-10-19 DIAGNOSIS — Z87898 Personal history of other specified conditions: Secondary | ICD-10-CM | POA: Diagnosis not present

## 2020-10-19 DIAGNOSIS — Z96649 Presence of unspecified artificial hip joint: Secondary | ICD-10-CM | POA: Diagnosis not present

## 2020-10-19 LAB — BASIC METABOLIC PANEL
Anion gap: 9 (ref 5–15)
BUN: 12 mg/dL (ref 8–23)
CO2: 25 mmol/L (ref 22–32)
Calcium: 8.4 mg/dL — ABNORMAL LOW (ref 8.9–10.3)
Chloride: 95 mmol/L — ABNORMAL LOW (ref 98–111)
Creatinine, Ser: 0.67 mg/dL (ref 0.61–1.24)
GFR, Estimated: >60 mL/min (ref >60)
Glucose, Bld: 106 mg/dL — ABNORMAL HIGH (ref 70–99)
Potassium: 4.1 mmol/L (ref 3.5–5.1)
Sodium: 129 mmol/L — ABNORMAL LOW (ref 135–145)

## 2020-10-19 LAB — SODIUM, URINE, RANDOM: Sodium, Ur: 105 mmol/L

## 2020-10-19 LAB — CBC
HCT: 35.9 % — ABNORMAL LOW (ref 39.0–52.0)
Hemoglobin: 12.3 g/dL — ABNORMAL LOW (ref 13.0–17.0)
MCH: 28.9 pg (ref 26.0–34.0)
MCHC: 34.3 g/dL (ref 30.0–36.0)
MCV: 84.3 fL (ref 80.0–100.0)
Platelets: 176 10*3/uL (ref 150–400)
RBC: 4.26 MIL/uL (ref 4.22–5.81)
RDW: 15.7 % — ABNORMAL HIGH (ref 11.5–15.5)
WBC: 8.7 10*3/uL (ref 4.0–10.5)
nRBC: 0 % (ref 0.0–0.2)

## 2020-10-19 LAB — OSMOLALITY: Osmolality: 270 mOsm/kg — ABNORMAL LOW (ref 275–295)

## 2020-10-19 LAB — OSMOLALITY, URINE: Osmolality, Ur: 751 mOsm/kg (ref 300–900)

## 2020-10-19 MED ORDER — ENOXAPARIN SODIUM 30 MG/0.3ML IJ SOSY
30.0000 mg | PREFILLED_SYRINGE | Freq: Once | INTRAMUSCULAR | Status: AC
  Administered 2020-10-19: 30 mg via SUBCUTANEOUS
  Filled 2020-10-19: qty 0.3

## 2020-10-19 MED ORDER — CEFAZOLIN SODIUM-DEXTROSE 2-4 GM/100ML-% IV SOLN
2.0000 g | INTRAVENOUS | Status: AC
  Administered 2020-10-20: 2 g via INTRAVENOUS
  Filled 2020-10-19 (×2): qty 100

## 2020-10-19 MED ORDER — CHLORHEXIDINE GLUCONATE 4 % EX LIQD: 60.0000 mL | Freq: Once | CUTANEOUS | Status: DC

## 2020-10-19 MED ORDER — POVIDONE-IODINE 10 % EX SWAB: 2.0000 "application " | Freq: Once | CUTANEOUS | Status: DC

## 2020-10-19 MED ORDER — TRANEXAMIC ACID-NACL 1000-0.7 MG/100ML-% IV SOLN
1000.0000 mg | INTRAVENOUS | Status: AC
  Administered 2020-10-20: 1000 mg via INTRAVENOUS
  Filled 2020-10-19: qty 100

## 2020-10-19 NOTE — Progress Notes (Signed)
Asked by Dr. Charlann Boxer to help with R [place] B1 ppx femur fx due to scheduling issues. Surgery is planned for tomorrow: ORIF R femur at [facility]. NPO after MN. Will give Lovenox this am and then hold chemical DVT ppx. Transfer to [facility] is pending bed availability. Hgb is > 12 after PRBCs. RD following for malnutrition. NPO After MN.

## 2020-10-19 NOTE — Progress Notes (Signed)
PROGRESS NOTE    Aaron [name]  DEY:[id number] DOB: April 11, 1940 DOA: 10/16/2020 PCP: Eartha Inch, MD   Chief Complaint  Patient presents with   Fall    Brief Narrative:    Aaron [name] is a 80 y.o. male with history of hypertension and seizures was brought to the ER after patient had a mechanical fall at home pain in the right hip, X-rays of the hip shows a right periprosthetic fracture.  Patient was seen by orthopedic surgery, plan for repair by Dr. Linna Caprice 9[name] transferred to [facility] pending bed availability.  Assessment & Plan:   Principal Problem:   Periprosthetic hip fracture, initial encounter Active Problems:   Normochromic normocytic anemia   History of seizure   Essential hypertension   Malnutrition of moderate degree   Right hip periprosthetic femur diaphyseal femur fracture -Mangement per orthopedic.  Plan for surgery tomorrow at [facility], transfer pending bed availability. -Subcu heparin for DVT prophylaxis, to be held after today's dose, holding aspirin per Ortho recommendation. --Patient reports right shoulder pain due to fall is improving, chest x-ray with no acute findings.  Acute blood loss anemia -Received 2 units per Ortho admission, hemoglobin is stable at 12 today. -B12 level significantly low as well, started on supplements.  To continue on discharge  B12 deficiency -Started on supplements  History of hypertension was hypotensive at presentation.   -Blood pressure soft to  acceptable, well hold lisinopril, will continue with as needed hydralazine as well  History of seizures on Dilantin 300 mg at bedtime.  Hyperlipidemia -Continue with statin   PCM -Nutritionist consulted   DVT prophylaxis: [place] lovenox held after today's dose in  anticipation of  surgery tomorrow Code Status: Full Family Communication: D/W son by phone today, Disposition:   Status is:  Inpatient  Remains inpatient appropriate because:Ongoing diagnostic testing needed not appropriate for outpatient work up  Dispo: The patient is from: Home              Anticipated d/c is to: SNF              Patient currently is not medically stable to d/c.   Difficult to place patient No       Consultants:  ortho   Subjective:  No nausea, no vomiting, pain is controlled with current current regimen.   -Shoulder  pain has significantly improved.    Objective: Vitals:   10/18/20 0855 10/18/20 1514 10/18/20 2001 10/19/20 0700  BP: (!) 156/84 135/73 114/66 118/80  Pulse: 70 72 80 77  Resp: 16 16 16 17   Temp: 98.6 F (37 C) (!) 97.5 F (36.4 C) 99 F (37.2 C) 98.3 F (36.8 C)  TempSrc: Oral Oral Oral Oral  SpO2: 100% 100% 98% 97%    Intake/Output Summary (Last 24 hours) at 10/19/2020 1221 Last data filed at 10/19/2020 1112 Gross per 24 hour  Intake --  Output 1100 ml  Net -1100 ml   There were no vitals filed for this visit.  Examination:   Awake Alert, Oriented X 3, No new F.N deficits, Normal affect Symmetrical Chest wall movement, Good air movement bilaterally, CTAB RRR,No Gallops,Rubs or new Murmurs, No Parasternal Heave +ve B.Sounds, Abd Soft, No tenderness, No rebound - guarding or rigidity. No Cyanosis, Clubbing or edema, right lower extremity in traction       Data Reviewed: I have personally reviewed following labs and imaging studies  CBC: Recent Labs  Lab 10/16/20 2323 10/17/20 0304 10/18/20 0208 10/19/20 0231  WBC 14.0* 8.2  --  8.7  NEUTROABS 12.4*  --   --   --   HGB 9.5* 8.7* 12.0* 12.3*  HCT 29.6* 26.5* 34.6* 35.9*  MCV 86.3 84.1  --  84.3  PLT 228 204  --  176    Basic Metabolic Panel: Recent Labs  Lab 10/16/20 2323 10/17/20 0304 10/19/20 0231  NA 135 131* 129*  K 3.9 4.2 4.1  CL 101 103 95*  CO2 22 23 25   GLUCOSE 105* 137* 106*  BUN 12 11 12   CREATININE 0.66 0.72 0.67  CALCIUM 8.1* 8.0* 8.4*    GFR: CrCl cannot  be calculated (Unknown ideal weight.).  Liver Function Tests: Recent Labs  Lab 10/16/20 2323  AST 22  ALT 17  ALKPHOS 112  BILITOT 0.6  PROT 4.4*  ALBUMIN 3.2*    CBG: No results for input(s): GLUCAP in the last 168 hours.   Recent Results (from the past 240 hour(s))  Resp Panel by RT-PCR (Flu A&B, Covid) Nasopharyngeal Swab     Status: None   Collection Time: 10/16/20 11:30 PM   Specimen: Nasopharyngeal Swab; Nasopharyngeal(NP) swabs in vial transport medium  Result Value Ref Range Status   SARS Coronavirus 2 by RT PCR NEGATIVE NEGATIVE Final    Comment: (NOTE) SARS-CoV-2 target nucleic acids are NOT DETECTED.  The SARS-CoV-2 RNA is generally detectable in upper respiratory specimens during the acute phase of infection. The lowest concentration of SARS-CoV-2 viral copies this assay can detect is 138 copies/mL. A negative result does not preclude SARS-Cov-2 infection and should not be used as the sole basis for treatment or other patient management decisions. A negative result may occur with  improper specimen collection/handling, submission of specimen other than nasopharyngeal swab, presence of viral mutation(s) within the areas targeted by this assay, and inadequate number of viral copies(<138 copies/mL). A negative result must be combined with clinical observations, patient history, and epidemiological information. The expected result is Negative.  Fact Sheet for Patients:  12/16/20  Fact Sheet for Healthcare Providers:  12/16/20  This test is no t yet approved or cleared by the [place] FDA and  has been authorized for detection and/or diagnosis of SARS-CoV-2 by FDA under an Emergency Use Authorization (EUA). This EUA will remain  in effect (meaning this test can be used) for the duration of the COVID-19 declaration under Section 564(b)(1) of the Act, 21 U.S.C.section 360bbb-3(b)(1), unless  the authorization is terminated  or revoked sooner.       Influenza A by PCR NEGATIVE NEGATIVE Final   Influenza B by PCR NEGATIVE NEGATIVE Final    Comment: (NOTE) The Xpert Xpress SARS-CoV-2/FLU/RSV plus assay is intended as an aid in the diagnosis of influenza from Nasopharyngeal swab specimens and should not be used as a sole basis for treatment. Nasal washings and aspirates are unacceptable for Xpert Xpress SARS-CoV-2/FLU/RSV testing.  Fact Sheet for Patients: SeriousBroker.it  Fact Sheet for Healthcare Providers: [place]  This test is not yet approved or cleared by the [place] FDA and has been authorized for detection and/or diagnosis of SARS-CoV-2 by FDA under an Emergency Use Authorization (EUA). This EUA will remain in effect (meaning this test can be used) for the duration of the COVID-19 declaration under Section 564(b)(1) of the Act, 21 U.S.C. section 360bbb-3(b)(1), unless the authorization is terminated or revoked.  Performed at [facility], [address], [place], [address] [place]  Radiology Studies: DG Shoulder Right Port  Result Date: 10/18/2020 CLINICAL DATA:  Proximal right humeral pain EXAM: PORTABLE RIGHT SHOULDER COMPARISON:  None. FINDINGS: There is no evidence of fracture or dislocation. There is no evidence of arthropathy or other focal bone abnormality. Soft tissues are unremarkable. IMPRESSION: Negative. Electronically Signed   By: Helyn Numbers M.D.   On: 10/18/2020 14:06        Scheduled Meds:  sodium chloride   Intravenous Once   atorvastatin  80 mg Oral Daily   cyanocobalamin  1,000 mcg Intramuscular Daily   Followed by   Melene Muller ON 10/24/2020] cyanocobalamin  1,000 mcg Intramuscular Weekly   feeding supplement  237 mL Oral BID BM   latanoprost  1 drop Both Eyes QHS   multivitamin with minerals  1 tablet Oral Daily   pantoprazole  40 mg Oral Daily    phenytoin  300 mg Oral QHS   senna-docusate  2 tablet Oral BID   Continuous Infusions:   LOS: 2 days       Huey Bienenstock, MD [facility]   To contact the attending provider between 7A-7P or the covering provider during after hours 7P-7A, please log into the web site [email or web address] and access using universal [place] password for that web site. If you do not have the password, please call the hospital operator.  10/19/2020, 12:21 PM

## 2020-10-19 NOTE — Progress Notes (Signed)
[name] is full and can not accept pt tonight per bed placement. They will have to transfer pt to [facility] before surgery tomorrow morning.

## 2020-10-20 ENCOUNTER — Inpatient Hospital Stay (HOSPITAL_COMMUNITY): Payer: Medicare Other | Admitting: Certified Registered Nurse Anesthetist

## 2020-10-20 ENCOUNTER — Inpatient Hospital Stay (HOSPITAL_COMMUNITY): Payer: Medicare Other

## 2020-10-20 ENCOUNTER — Encounter (HOSPITAL_COMMUNITY)
Admission: EM | Disposition: A | Discharge status: Skilled Nursing Facility | Payer: Self-pay | Source: Home / Self Care | Attending: Internal Medicine

## 2020-10-20 DIAGNOSIS — G40909 Epilepsy, unspecified, not intractable, without status epilepticus: Secondary | ICD-10-CM

## 2020-10-20 DIAGNOSIS — E538 Deficiency of other specified B group vitamins: Secondary | ICD-10-CM

## 2020-10-20 DIAGNOSIS — E440 Moderate protein-calorie malnutrition: Secondary | ICD-10-CM

## 2020-10-20 DIAGNOSIS — S72001A Fracture of unspecified part of neck of right femur, initial encounter for closed fracture: Secondary | ICD-10-CM | POA: Insufficient documentation

## 2020-10-20 DIAGNOSIS — D62 Acute posthemorrhagic anemia: Secondary | ICD-10-CM

## 2020-10-20 DIAGNOSIS — I10 Essential (primary) hypertension: Secondary | ICD-10-CM | POA: Diagnosis not present

## 2020-10-20 DIAGNOSIS — Z87898 Personal history of other specified conditions: Secondary | ICD-10-CM | POA: Diagnosis not present

## 2020-10-20 DIAGNOSIS — E871 Hypo-osmolality and hyponatremia: Secondary | ICD-10-CM

## 2020-10-20 DIAGNOSIS — E785 Hyperlipidemia, unspecified: Secondary | ICD-10-CM

## 2020-10-20 DIAGNOSIS — M978XXA Periprosthetic fracture around other internal prosthetic joint, initial encounter: Secondary | ICD-10-CM | POA: Diagnosis not present

## 2020-10-20 DIAGNOSIS — S72009A Fracture of unspecified part of neck of unspecified femur, initial encounter for closed fracture: Secondary | ICD-10-CM

## 2020-10-20 HISTORY — PX: ORIF FEMUR FRACTURE: SHX2119

## 2020-10-20 LAB — CBC
HCT: 36.2 % — ABNORMAL LOW (ref 39.0–52.0)
Hemoglobin: 12.1 g/dL — ABNORMAL LOW (ref 13.0–17.0)
MCH: 28.2 pg (ref 26.0–34.0)
MCHC: 33.4 g/dL (ref 30.0–36.0)
MCV: 84.4 fL (ref 80.0–100.0)
Platelets: 202 10*3/uL (ref 150–400)
RBC: 4.29 MIL/uL (ref 4.22–5.81)
RDW: 15.8 % — ABNORMAL HIGH (ref 11.5–15.5)
WBC: 10.6 10*3/uL — ABNORMAL HIGH (ref 4.0–10.5)
nRBC: 0 % (ref 0.0–0.2)

## 2020-10-20 LAB — BASIC METABOLIC PANEL
Anion gap: 10 (ref 5–15)
BUN: 15 mg/dL (ref 8–23)
CO2: 23 mmol/L (ref 22–32)
Calcium: 8.3 mg/dL — ABNORMAL LOW (ref 8.9–10.3)
Chloride: 96 mmol/L — ABNORMAL LOW (ref 98–111)
Creatinine, Ser: 0.66 mg/dL (ref 0.61–1.24)
GFR, Estimated: >60 mL/min (ref >60)
Glucose, Bld: 110 mg/dL — ABNORMAL HIGH (ref 70–99)
Potassium: 4.1 mmol/L (ref 3.5–5.1)
Sodium: 129 mmol/L — ABNORMAL LOW (ref 135–145)

## 2020-10-20 IMAGING — XA DG FEMUR 2+V*R*
1 series · 2 of 2 positions shown · non-contrast
Comparison: Femur radiographs and CT [DATE] and [DATE],
respectively

CLINICAL DATA: Open reduction and internal fixation of
periprosthetic femur fracture

EXAM:
RIGHT FEMUR 2 VIEWS

[Series 1: unknown protocol · 2 of 2 slices shown]
[im 1/2]
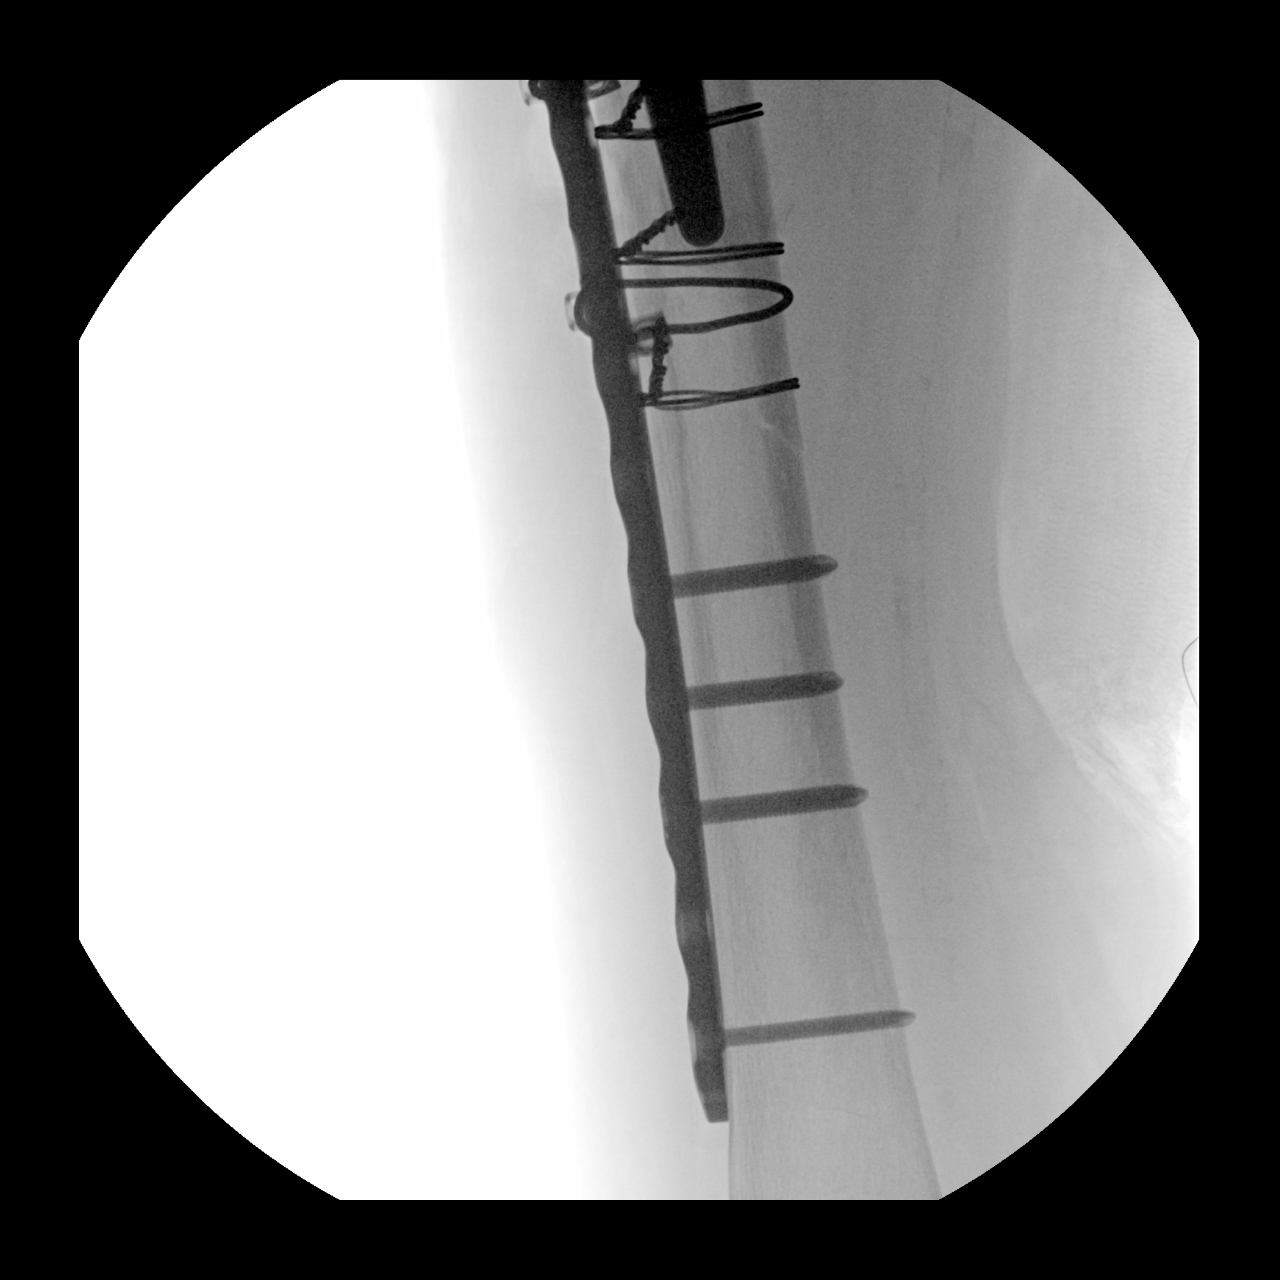
[im 2/2]
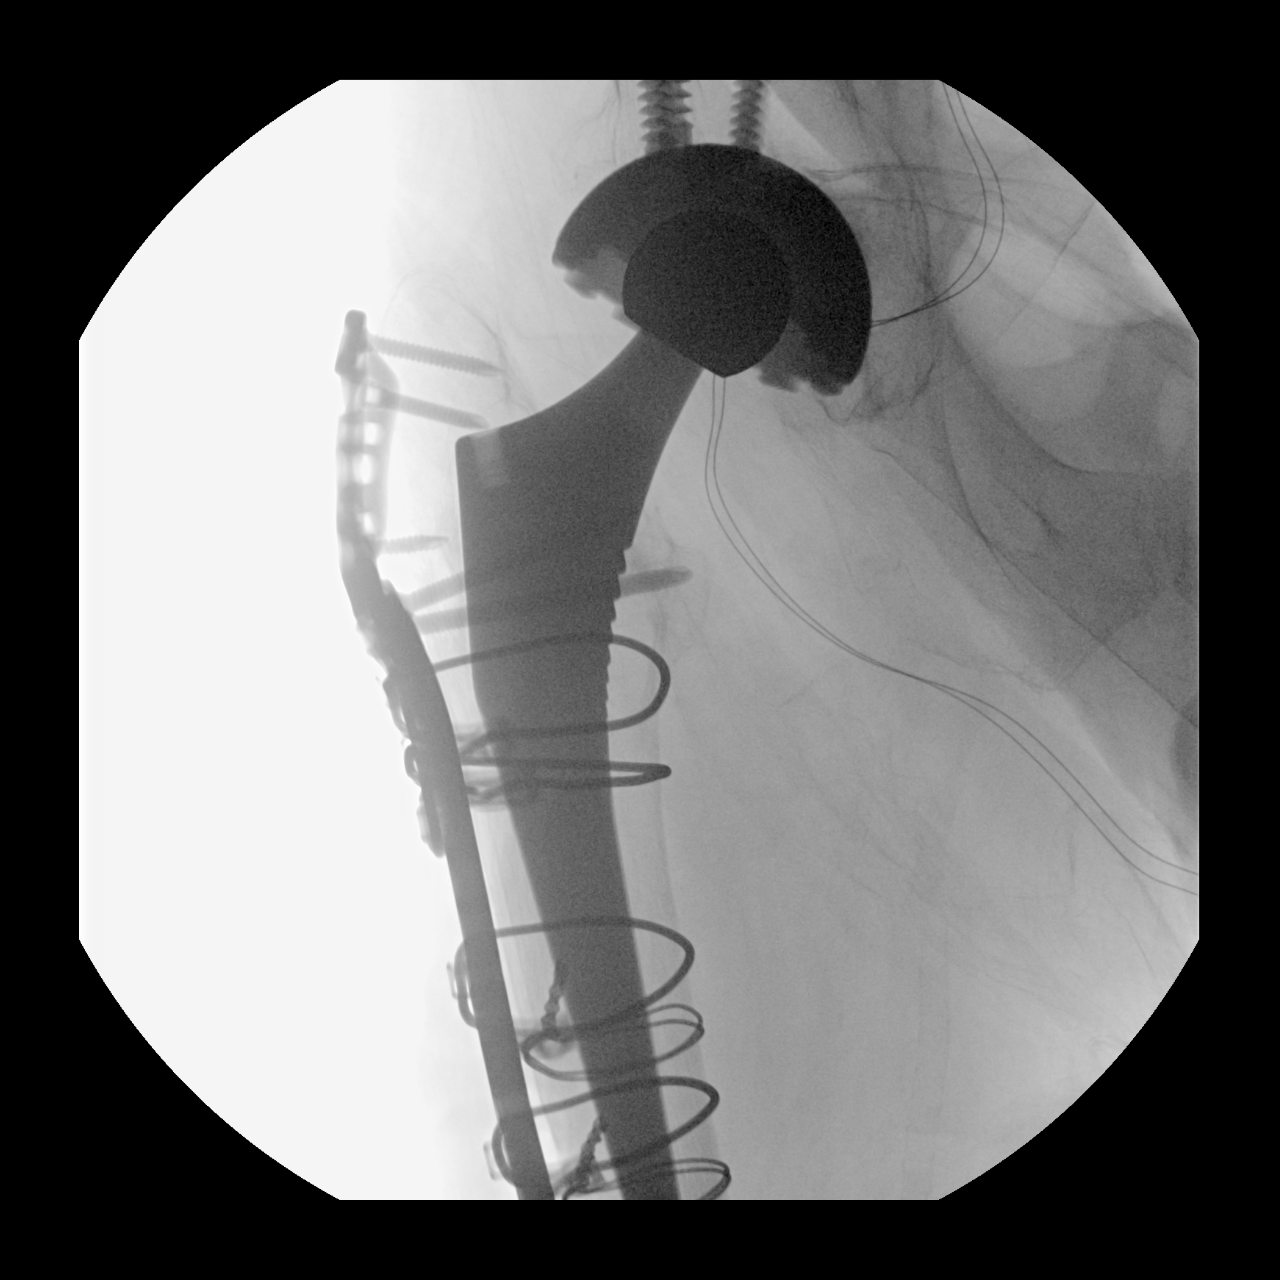

[2 of 2 positions shown; findings below may reference images not displayed]

FINDINGS: Two C-arm fluoroscopic images were obtained intraoperatively and
submitted for post operative interpretation. There is new sideplate
and screw and cerclage wire fixation of the previously identified
displaced periprosthetic femur fracture. Alignment is now anatomic.
Pre-existing hip arthroplasty hardware is also noted. Fluoroscopy
time 54 seconds. Please see the performing provider's procedural
report for further detail.
IMPRESSION: Status post fixation of a periprosthetic right femur fracture as
above.

## 2020-10-20 SURGERY — OPEN REDUCTION INTERNAL FIXATION FEMORAL SHAFT FRACTURE
Anesthesia: General | Laterality: Right

## 2020-10-20 MED ORDER — EPHEDRINE 5 MG/ML INJ
INTRAVENOUS | Status: AC
  Filled 2020-10-20: qty 5

## 2020-10-20 MED ORDER — MENTHOL 3 MG MT LOZG: 1.0000 | LOZENGE | OROMUCOSAL | Status: DC | PRN

## 2020-10-20 MED ORDER — CHLORHEXIDINE GLUCONATE CLOTH 2 % EX PADS
6.0000 | MEDICATED_PAD | Freq: Every day | CUTANEOUS | Status: DC
  Administered 2020-10-20: 6 via TOPICAL

## 2020-10-20 MED ORDER — HYDROMORPHONE HCL 1 MG/ML IJ SOLN
INTRAMUSCULAR | Status: AC
  Filled 2020-10-20: qty 1

## 2020-10-20 MED ORDER — LACTATED RINGERS IV SOLN: INTRAVENOUS | Status: DC

## 2020-10-20 MED ORDER — TRANEXAMIC ACID-NACL 1000-0.7 MG/100ML-% IV SOLN
1000.0000 mg | Freq: Once | INTRAVENOUS | Status: AC
  Administered 2020-10-20: 1000 mg via INTRAVENOUS
  Filled 2020-10-20: qty 100

## 2020-10-20 MED ORDER — FENTANYL CITRATE PF 50 MCG/ML IJ SOSY
25.0000 ug | PREFILLED_SYRINGE | INTRAMUSCULAR | Status: DC | PRN
  Administered 2020-10-20 (×3): 50 ug via INTRAVENOUS

## 2020-10-20 MED ORDER — ONDANSETRON HCL 4 MG/2ML IJ SOLN: 4.0000 mg | Freq: Once | INTRAMUSCULAR | Status: DC | PRN

## 2020-10-20 MED ORDER — METOCLOPRAMIDE HCL 5 MG PO TABS: 5.0000 mg | ORAL_TABLET | Freq: Three times a day (TID) | ORAL | Status: DC | PRN

## 2020-10-20 MED ORDER — LIDOCAINE 2% (20 MG/ML) 5 ML SYRINGE
INTRAMUSCULAR | Status: DC | PRN
  Administered 2020-10-20: 80 mg via INTRAVENOUS

## 2020-10-20 MED ORDER — BUPIVACAINE-EPINEPHRINE (PF) 0.25% -1:200000 IJ SOLN
INTRAMUSCULAR | Status: AC
  Filled 2020-10-20: qty 30

## 2020-10-20 MED ORDER — METHOCARBAMOL 500 MG PO TABS: 500.0000 mg | ORAL_TABLET | Freq: Four times a day (QID) | ORAL | Status: DC | PRN

## 2020-10-20 MED ORDER — ACETAMINOPHEN 10 MG/ML IV SOLN
1000.0000 mg | Freq: Once | INTRAVENOUS | Status: DC | PRN
Start: 2020-10-20 — End: 2020-10-20
  Administered 2020-10-20: 1000 mg via INTRAVENOUS

## 2020-10-20 MED ORDER — EPHEDRINE SULFATE-NACL 50-0.9 MG/10ML-% IV SOSY
PREFILLED_SYRINGE | INTRAVENOUS | Status: DC | PRN
  Administered 2020-10-20: 7.5 mg via INTRAVENOUS
  Administered 2020-10-20: 5 mg via INTRAVENOUS

## 2020-10-20 MED ORDER — DEXAMETHASONE SODIUM PHOSPHATE 4 MG/ML IJ SOLN
INTRAMUSCULAR | Status: DC | PRN
  Administered 2020-10-20: 5 mg via INTRAVENOUS

## 2020-10-20 MED ORDER — VANCOMYCIN HCL 1000 MG IV SOLR
INTRAVENOUS | Status: DC | PRN
  Administered 2020-10-20: 1000 mg via TOPICAL

## 2020-10-20 MED ORDER — ENOXAPARIN SODIUM 30 MG/0.3ML IJ SOSY
30.0000 mg | PREFILLED_SYRINGE | INTRAMUSCULAR | Status: DC
Start: 2020-10-21 — End: 2020-10-27
  Administered 2020-10-21 – 2020-10-27 (×7): 30 mg via SUBCUTANEOUS
  Filled 2020-10-20 (×7): qty 0.3

## 2020-10-20 MED ORDER — SODIUM CHLORIDE 0.9 % IV SOLN: INTRAVENOUS | Status: DC

## 2020-10-20 MED ORDER — HYDROMORPHONE HCL 1 MG/ML IJ SOLN
0.2500 mg | INTRAMUSCULAR | Status: DC | PRN
  Administered 2020-10-20 (×2): 0.5 mg via INTRAVENOUS

## 2020-10-20 MED ORDER — VANCOMYCIN HCL 1000 MG IV SOLR
INTRAVENOUS | Status: AC
  Filled 2020-10-20: qty 20

## 2020-10-20 MED ORDER — PROPOFOL 10 MG/ML IV BOLUS
INTRAVENOUS | Status: AC
  Filled 2020-10-20: qty 20

## 2020-10-20 MED ORDER — FENTANYL CITRATE (PF) 100 MCG/2ML IJ SOLN
INTRAMUSCULAR | Status: AC
  Filled 2020-10-20: qty 2

## 2020-10-20 MED ORDER — FENTANYL CITRATE PF 50 MCG/ML IJ SOSY
PREFILLED_SYRINGE | INTRAMUSCULAR | Status: AC
  Filled 2020-10-20: qty 2

## 2020-10-20 MED ORDER — ROCURONIUM BROMIDE 10 MG/ML (PF) SYRINGE
PREFILLED_SYRINGE | INTRAVENOUS | Status: DC | PRN
  Administered 2020-10-20: 60 mg via INTRAVENOUS

## 2020-10-20 MED ORDER — SUGAMMADEX SODIUM 200 MG/2ML IV SOLN
INTRAVENOUS | Status: DC | PRN
  Administered 2020-10-20: 200 mg via INTRAVENOUS

## 2020-10-20 MED ORDER — DOCUSATE SODIUM 100 MG PO CAPS
100.0000 mg | ORAL_CAPSULE | Freq: Two times a day (BID) | ORAL | Status: DC
  Administered 2020-10-20 – 2020-10-28 (×14): 100 mg via ORAL
  Filled 2020-10-20 (×16): qty 1

## 2020-10-20 MED ORDER — ONDANSETRON HCL 4 MG/2ML IJ SOLN
INTRAMUSCULAR | Status: DC | PRN
  Administered 2020-10-20: 4 mg via INTRAVENOUS

## 2020-10-20 MED ORDER — METHOCARBAMOL 500 MG IVPB - SIMPLE MED
500.0000 mg | Freq: Four times a day (QID) | INTRAVENOUS | Status: DC | PRN
  Administered 2020-10-20: 500 mg via INTRAVENOUS
  Filled 2020-10-20: qty 50

## 2020-10-20 MED ORDER — DEXAMETHASONE SODIUM PHOSPHATE 10 MG/ML IJ SOLN
INTRAMUSCULAR | Status: AC
  Filled 2020-10-20: qty 1

## 2020-10-20 MED ORDER — DEXMEDETOMIDINE (PRECEDEX) IN NS 20 MCG/5ML (4 MCG/ML) IV SYRINGE
PREFILLED_SYRINGE | INTRAVENOUS | Status: AC
  Filled 2020-10-20: qty 5

## 2020-10-20 MED ORDER — FENTANYL CITRATE (PF) 100 MCG/2ML IJ SOLN
INTRAMUSCULAR | Status: DC | PRN
  Administered 2020-10-20 (×5): 50 ug via INTRAVENOUS

## 2020-10-20 MED ORDER — PHENYLEPHRINE 40 MCG/ML (10ML) SYRINGE FOR IV PUSH (FOR BLOOD PRESSURE SUPPORT)
PREFILLED_SYRINGE | INTRAVENOUS | Status: AC
  Filled 2020-10-20: qty 10

## 2020-10-20 MED ORDER — PHENYLEPHRINE 40 MCG/ML (10ML) SYRINGE FOR IV PUSH (FOR BLOOD PRESSURE SUPPORT)
PREFILLED_SYRINGE | INTRAVENOUS | Status: DC | PRN
  Administered 2020-10-20 (×2): 120 ug via INTRAVENOUS
  Administered 2020-10-20: 80 ug via INTRAVENOUS
  Administered 2020-10-20: 120 ug via INTRAVENOUS

## 2020-10-20 MED ORDER — METHOCARBAMOL 500 MG IVPB - SIMPLE MED
INTRAVENOUS | Status: AC
  Filled 2020-10-20: qty 50

## 2020-10-20 MED ORDER — KETOROLAC TROMETHAMINE 30 MG/ML IJ SOLN
INTRAMUSCULAR | Status: AC
  Filled 2020-10-20: qty 1

## 2020-10-20 MED ORDER — DEXMEDETOMIDINE (PRECEDEX) IN NS 20 MCG/5ML (4 MCG/ML) IV SYRINGE
PREFILLED_SYRINGE | INTRAVENOUS | Status: DC | PRN
  Administered 2020-10-20: 8 ug via INTRAVENOUS

## 2020-10-20 MED ORDER — SENNA 8.6 MG PO TABS
1.0000 | ORAL_TABLET | Freq: Two times a day (BID) | ORAL | Status: DC
  Administered 2020-10-20 – 2020-10-28 (×15): 8.6 mg via ORAL
  Filled 2020-10-20 (×16): qty 1

## 2020-10-20 MED ORDER — ONDANSETRON HCL 4 MG/2ML IJ SOLN: 4.0000 mg | Freq: Four times a day (QID) | INTRAMUSCULAR | Status: DC | PRN

## 2020-10-20 MED ORDER — METOCLOPRAMIDE HCL 5 MG/ML IJ SOLN: 5.0000 mg | Freq: Three times a day (TID) | INTRAMUSCULAR | Status: DC | PRN

## 2020-10-20 MED ORDER — PROPOFOL 10 MG/ML IV BOLUS
INTRAVENOUS | Status: DC | PRN
  Administered 2020-10-20: 60 mg via INTRAVENOUS

## 2020-10-20 MED ORDER — ACETAMINOPHEN 10 MG/ML IV SOLN
INTRAVENOUS | Status: AC
  Filled 2020-10-20: qty 100

## 2020-10-20 MED ORDER — FENTANYL CITRATE PF 50 MCG/ML IJ SOSY
PREFILLED_SYRINGE | INTRAMUSCULAR | Status: AC
  Filled 2020-10-20: qty 1

## 2020-10-20 MED ORDER — PHENOL 1.4 % MT LIQD: 1.0000 | OROMUCOSAL | Status: DC | PRN

## 2020-10-20 MED ORDER — 0.9 % SODIUM CHLORIDE (POUR BTL) OPTIME
TOPICAL | Status: DC | PRN
  Administered 2020-10-20: 1000 mL

## 2020-10-20 MED ORDER — ONDANSETRON HCL 4 MG/2ML IJ SOLN
INTRAMUSCULAR | Status: AC
  Filled 2020-10-20: qty 2

## 2020-10-20 MED ORDER — ONDANSETRON HCL 4 MG PO TABS: 4.0000 mg | ORAL_TABLET | Freq: Four times a day (QID) | ORAL | Status: DC | PRN

## 2020-10-20 MED ORDER — CEFAZOLIN SODIUM-DEXTROSE 2-4 GM/100ML-% IV SOLN
2.0000 g | Freq: Four times a day (QID) | INTRAVENOUS | Status: AC
Start: 2020-10-20 — End: 2020-10-21
  Administered 2020-10-20 – 2020-10-21 (×2): 2 g via INTRAVENOUS
  Filled 2020-10-20 (×3): qty 100

## 2020-10-20 SURGICAL SUPPLY — 73 items
BAG COUNTER SPONGE SURGICOUNT (BAG) IMPLANT
BAG ZIPLOCK 12X15 (MISCELLANEOUS) ×2 IMPLANT
BIT DRILL 2.7 (BIT) ×1
BIT DRILL 2.7MM (BIT) IMPLANT
BIT DRILL 4.3 (BIT) ×2 IMPLANT
BIT DRILL 4.3X300MM (BIT) IMPLANT
BIT DRILL LONG 3.3 (BIT) ×1 IMPLANT
BIT DRILL QC 3.3X195 (BIT) ×1 IMPLANT
BNDG GAUZE ELAST 4 BULKY (GAUZE/BANDAGES/DRESSINGS) ×2 IMPLANT
CABLE CERLAGE W/CRIMP 1.8 (Cable) ×5 IMPLANT
CABLE GTR COCR 1.8X635 (Orthopedic Implant) IMPLANT
CAP LOCK NCB (Cap) ×5 IMPLANT
CHLORAPREP W/TINT 26 (MISCELLANEOUS) ×2 IMPLANT
COVER SURGICAL LIGHT HANDLE (MISCELLANEOUS) ×2 IMPLANT
DERMABOND ADVANCED (GAUZE/BANDAGES/DRESSINGS) ×1
DERMABOND ADVANCED .7 DNX12 (GAUZE/BANDAGES/DRESSINGS) IMPLANT
DRAPE C-ARM 42X120 X-RAY (DRAPES) ×2 IMPLANT
DRAPE C-ARMOR (DRAPES) ×2 IMPLANT
DRAPE ORTHO SPLIT 77X108 STRL (DRAPES) ×2
DRAPE POUCH INSTRU U-SHP 10X18 (DRAPES) ×2 IMPLANT
DRAPE SHEET LG 3/4 BI-LAMINATE (DRAPES) ×6 IMPLANT
DRAPE SURG ORHT 6 SPLT 77X108 (DRAPES) ×2 IMPLANT
DRILL BIT 2.7MM (BIT) ×1
DRILL BIT 4.3 (BIT) ×1
DRSG AQUACEL AG ADV 3.5X 6 (GAUZE/BANDAGES/DRESSINGS) ×1 IMPLANT
DRSG AQUACEL AG ADV 3.5X14 (GAUZE/BANDAGES/DRESSINGS) ×1 IMPLANT
DRSG EMULSION OIL 3X16 NADH (GAUZE/BANDAGES/DRESSINGS) ×2 IMPLANT
ELECT REM PT RETURN 15FT ADLT (MISCELLANEOUS) ×2 IMPLANT
GAUZE SPONGE 4X4 12PLY STRL (GAUZE/BANDAGES/DRESSINGS) ×2 IMPLANT
GLOVE SRG 8 PF TXTR STRL LF DI (GLOVE) ×1 IMPLANT
GLOVE SURG ENC MOIS LTX SZ8.5 (GLOVE) ×4 IMPLANT
GLOVE SURG ENC TEXT LTX SZ7 (GLOVE) IMPLANT
GLOVE SURG ENC TEXT LTX SZ7.5 (GLOVE) ×4 IMPLANT
GLOVE SURG UNDER POLY LF SZ7.5 (GLOVE) IMPLANT
GLOVE SURG UNDER POLY LF SZ8 (GLOVE) ×1
GLOVE SURG UNDER POLY LF SZ8.5 (GLOVE) ×2 IMPLANT
GOWN SPEC L3 XXLG W/TWL (GOWN DISPOSABLE) ×4 IMPLANT
GOWN STRL REUS W/TWL LRG LVL3 (GOWN DISPOSABLE) IMPLANT
JET LAVAGE IRRISEPT WOUND (IRRIGATION / IRRIGATOR)
K-WIRE 2.0 (WIRE) ×1
K-WIRE FXSTD 280X2XNS SS (WIRE) ×1
KIT BASIN OR (CUSTOM PROCEDURE TRAY) ×2 IMPLANT
KIT TURNOVER KIT A (KITS) ×2 IMPLANT
KWIRE FXSTD 280X2XNS SS (WIRE) IMPLANT
LAVAGE JET IRRISEPT WOUND (IRRIGATION / IRRIGATOR) IMPLANT
LOCKPLATE CABLE BUTTON NCP HIP (Orthopedic Implant) ×4 IMPLANT
MANIFOLD NEPTUNE II (INSTRUMENTS) ×2 IMPLANT
NS IRRIG 1000ML POUR BTL (IV SOLUTION) ×2 IMPLANT
PACK TOTAL JOINT (CUSTOM PROCEDURE TRAY) ×2 IMPLANT
PENCIL SMOKE EVACUATOR (MISCELLANEOUS) IMPLANT
PLATE NCB TROC RT WIDE (Miscellaneous) ×1 IMPLANT
PLATE PROXIMAL FEMUR 12H RT (Plate) ×1 IMPLANT
PROTECTOR NERVE ULNAR (MISCELLANEOUS) ×2 IMPLANT
SCREW 5.0 32MM (Screw) ×1 IMPLANT
SCREW LOCK TI 3.5X16 (Screw) ×1 IMPLANT
SCREW LOCK TI 3.5X24 (Screw) ×1 IMPLANT
SCREW LOCK TI 3.5X26 (Screw) ×1 IMPLANT
SCREW NCB 4.0 28MM (Screw) ×1 IMPLANT
SCREW NCB 4.0MX46M (Screw) ×1 IMPLANT
SCREW NCB 4.0MX50M (Screw) ×1 IMPLANT
SCREW NCB 5.0X34MM (Screw) ×2 IMPLANT
STAPLER VISISTAT 35W (STAPLE) ×2 IMPLANT
STRIP CLOSURE SKIN 1/2X4 (GAUZE/BANDAGES/DRESSINGS) ×2 IMPLANT
SUT MNCRL AB 3-0 PS2 18 (SUTURE) ×2 IMPLANT
SUT MNCRL AB 4-0 PS2 18 (SUTURE) ×2 IMPLANT
SUT STRATAFIX PDO 1 14 VIOLET (SUTURE) ×1
SUT STRATFX PDO 1 14 VIOLET (SUTURE) ×1
SUT VIC AB 1 CT1 36 (SUTURE) ×4 IMPLANT
SUT VIC AB 2-0 CT1 27 (SUTURE) ×2
SUT VIC AB 2-0 CT1 TAPERPNT 27 (SUTURE) ×2 IMPLANT
SUTURE STRATFX PDO 1 14 VIOLET (SUTURE) ×1 IMPLANT
TOWEL OR 17X26 10 PK STRL BLUE (TOWEL DISPOSABLE) ×4 IMPLANT
WATER STERILE IRR 1000ML POUR (IV SOLUTION) ×2 IMPLANT
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
## 2020-10-20 NOTE — Plan of Care (Signed)

## 2020-10-20 NOTE — Transfer of Care (Signed)
Immediate Anesthesia Transfer of Care Note  Patient: Aaron [name]  Procedure(s) Performed: OPEN REDUCTION INTERNAL FIXATION OF PERIPROSTHETIC FEMUR FRACTURE (Right)  Patient Location: PACU  Anesthesia Type:General  Level of Consciousness: sedated  Airway & Oxygen Therapy: Patient Spontanous Breathing and Patient connected to face mask oxygen  Post-op Assessment: Report given to RN and Post -op Vital signs reviewed and stable  Post vital signs: Reviewed and stable  Last Vitals:  Vitals Value Taken Time  BP 114/66 10/20/20 1523  Temp    Pulse 86 10/20/20 1525  Resp    SpO2 100 % 10/20/20 1525  Vitals shown include unvalidated device data.  Last Pain:  Vitals:   10/20/20 1018  TempSrc: Oral  PainSc:          Complications: No notable events documented.
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
## 2020-10-20 NOTE — Progress Notes (Signed)
Patient's son updated via phone

## 2020-10-20 NOTE — Plan of Care (Signed)

## 2020-10-20 NOTE — Assessment & Plan Note (Addendum)
--  monitor phenytoin level -Continue phenytoin

## 2020-10-20 NOTE — Anesthesia Preprocedure Evaluation (Addendum)
Anesthesia Evaluation  Patient identified by MRN, date of birth, ID band Patient awake    Reviewed: Allergy & Precautions, Patient's Chart, lab work & pertinent test results  Airway Mallampati: II  TM Distance: >3 FB Neck ROM: Full    Dental no notable dental hx.    Pulmonary neg pulmonary ROS,    Pulmonary exam normal        Cardiovascular hypertension, Pt. on medications  Rhythm:Regular Rate:Normal     Neuro/Psych Seizures -,  negative psych ROS   GI/Hepatic Neg liver ROS,   Endo/Other  negative endocrine ROS  Renal/GU Lab Results      Component                Value               Date                      CREATININE               0.66                10/20/2020                BUN                      15                  10/20/2020                NA                       129 (L)             10/20/2020                K                        4.1                 10/20/2020                CL                       96 (L)              10/20/2020                CO2                      23                  10/20/2020                Musculoskeletal  (+) Arthritis ,   Abdominal   Peds  Hematology Lab Results      Component                Value               Date                      WBC                      10.6 (H)            10/20/2020  HGB                      12.1 (L)            10/20/2020                HCT                      36.2 (L)            10/20/2020                MCV                      84.4                10/20/2020                PLT                      202                 10/20/2020              Anesthesia Other Findings   Reproductive/Obstetrics                           Anesthesia Physical Anesthesia Plan  ASA: 3  Anesthesia Plan: General   Post-op Pain Management:    Induction: Intravenous  PONV Risk Score and Plan: Treatment may vary due to age or  medical condition and Ondansetron  Airway Management Planned: Oral ETT  Additional Equipment: None  Intra-op Plan:   Post-operative Plan: Extubation in OR  Informed Consent: I have reviewed the patients History and Physical, chart, labs and discussed the procedure including the risks, benefits and alternatives for the proposed anesthesia with the patient or authorized representative who has indicated his/her understanding and acceptance.     Dental advisory given  Plan Discussed with:   Anesthesia Plan Comments:        Anesthesia Quick Evaluation

## 2020-10-20 NOTE — Assessment & Plan Note (Signed)
--  Ensure Enlive, MVI, B-12

## 2020-10-20 NOTE — Assessment & Plan Note (Addendum)
-   B12 level 151 on admission  --continue supplementation, f/u as an outpt - could be contributing to his balance problems as well

## 2020-10-20 NOTE — Discharge Instructions (Addendum)
Dr. Samson Frederic Adult Hip & Knee Specialist [facility] [address], [address] [place], [place] [address] [phone]   POSTOPERATIVE DIRECTIONS    Hip Rehabilitation, Guidelines Following Surgery   WEIGHT BEARING Partial weight bearing with assist device as directed.  30% WB RLE with walker   HOME CARE INSTRUCTIONS  Remove items at home which could result in a fall. This includes throw rugs or furniture in walking pathways.  Continue medications as instructed at time of discharge. You may have some home medications which will be placed on hold until you complete the course of blood thinner medication. 4 days after discharge, you may start showering. No tub baths or soaking your incisions. Do not put on socks or shoes without following the instructions of your caregivers.   Sit on chairs with arms. Use the chair arms to help push yourself up when arising.  Arrange for the use of a toilet seat elevator so you are not sitting low.  Walk with walker as instructed.  You may resume a sexual relationship in one month or when given the OK by your caregiver.  Use walker as long as suggested by your caregivers.  Avoid periods of inactivity such as sitting longer than an hour when not asleep. This helps prevent blood clots.  You may return to work once you are cleared by Designer, industrial/product.  Do not drive a car for 6 weeks or until released by your surgeon.  Do not drive while taking narcotics.  Wear elastic stockings for two weeks following surgery during the day but you may remove then at night.  Make sure you keep all of your appointments after your operation with all of your doctors and caregivers. You should call the office at the above phone number and make an appointment for approximately two weeks after the date of your surgery. Please pick up a stool softener and laxative for home use as long as you are requiring pain medications. ICE to the affected hip every three  hours for 30 minutes at a time and then as needed for pain and swelling. Continue to use ice on the hip for pain and swelling from surgery. You may notice swelling that will progress down to the foot and ankle.  This is normal after surgery.  Elevate the leg when you are not up walking on it.   It is important for you to complete the blood thinner medication as prescribed by your doctor. Continue to use the breathing machine which will help keep your temperature down.  It is common for your temperature to cycle up and down following surgery, especially at night when you are not up moving around and exerting yourself.  The breathing machine keeps your lungs expanded and your temperature down.  RANGE OF MOTION AND STRENGTHENING EXERCISES  These exercises are designed to help you keep full movement of your hip joint. Follow your caregiver's or physical therapist's instructions. Perform all exercises about fifteen times, three times per day or as directed. Exercise both hips, even if you have had only one joint replacement. These exercises can be done on a training (exercise) mat, on the floor, on a table or on a bed. Use whatever works the best and is most comfortable for you. Use music or television while you are exercising so that the exercises are a pleasant break in your day. This will make your life better with the exercises acting as a break in routine you can look forward to.  Lying on your back, slowly slide your foot toward your buttocks, raising your knee up off the floor. Then slowly slide your foot back down until your leg is straight again.  Lying on your back spread your legs as far apart as you can without causing discomfort.  Lying on your side, raise your upper leg and foot straight up from the floor as far as is comfortable. Slowly lower the leg and repeat.  Lying on your back, tighten up the muscle in the front of your thigh (quadriceps muscles). You can do this by keeping your leg straight  and trying to raise your heel off the floor. This helps strengthen the largest muscle supporting your knee.  Lying on your back, tighten up the muscles of your buttocks both with the legs straight and with the knee bent at a comfortable angle while keeping your heel on the floor.   SKILLED REHAB INSTRUCTIONS: If the patient is transferred to a skilled rehab facility following release from the hospital, a list of the current medications will be sent to the facility for the patient to continue.  When discharged from the skilled rehab facility, please have the facility set up the patient's Home Health Physical Therapy prior to being released. Also, the skilled facility will be responsible for providing the patient with their medications at time of release from the facility to include their pain medication and their blood thinner medication. If the patient is still at the rehab facility at time of the two week follow up appointment, the skilled rehab facility will also need to assist the patient in arranging follow up appointment in our office and any transportation needs.  MAKE SURE YOU:  Understand these instructions.  Will watch your condition.  Will get help right away if you are not doing well or get worse.  Pick up stool softner and laxative for home use following surgery while on pain medications. Daily dry dressing changes as needed. In 4 days, you may remove your dressings and begin taking showers - no tub baths or soaking the incisions. Continue to use ice for pain and swelling after surgery. Do not use any lotions or creams on the incision until instructed by your surgeon.   Additional discharge instructions  Please get your medications reviewed and adjusted by your Primary MD.  Please request your Primary MD to go over all Hospital Tests and Procedure/Radiological results at the follow up, please get all Hospital records sent to your Prim MD by signing hospital release before you go  home.  If you had Pneumonia of Lung problems at the Hospital: Please get a 2 view Chest X ray done in 6-8 weeks after hospital discharge or sooner if instructed by your Primary MD.  If you have Congestive Heart Failure: Please call your Cardiologist or Primary MD anytime you have any of the following symptoms:  1) 3 pound weight gain in 24 hours or 5 pounds in 1 week  2) shortness of breath, with or without a dry hacking cough  3) swelling in the hands, feet or stomach  4) if you have to sleep on extra pillows at night in order to breathe  Follow cardiac low salt diet and 1.5 lit/day fluid restriction.  If you have diabetes Accuchecks 4 times/day, Once in AM empty stomach and then before each meal. Log in all results and show them to your primary doctor at your next visit. If any glucose reading is under 80 or above 300 call your  primary MD immediately.  If you have Seizure/Convulsions/Epilepsy: Please do not drive, operate heavy machinery, participate in activities at heights or participate in high speed sports until you have seen by Primary MD or a Neurologist and advised to do so again.  If you had Gastrointestinal Bleeding: Please ask your Primary MD to check a complete blood count within one week of discharge or at your next visit. Your endoscopic/colonoscopic biopsies that are pending at the time of discharge, will also need to followed by your Primary MD.  Get Medicines reviewed and adjusted. Please take all your medications with you for your next visit with your Primary MD  Please request your Primary MD to go over all hospital tests and procedure/radiological results at the follow up, please ask your Primary MD to get all Hospital records sent to his/her office.  If you experience worsening of your admission symptoms, develop shortness of breath, life threatening emergency, suicidal or homicidal thoughts you must seek medical attention immediately by calling 911 or calling your  MD immediately  if symptoms less severe.  You must read complete instructions/literature along with all the possible adverse reactions/side effects for all the Medicines you take and that have been prescribed to you. Take any new Medicines after you have completely understood and accpet all the possible adverse reactions/side effects.   Do not drive or operate heavy machinery when taking Pain medications.   Do not take more than prescribed Pain, Sleep and Anxiety Medications  Special Instructions: If you have smoked or chewed Tobacco  in the last 2 yrs please stop smoking, stop any regular Alcohol  and or any Recreational drug use.  Wear Seat belts while driving.  Please note You were cared for by a hospitalist during your hospital stay. If you have any questions about your discharge medications or the care you received while you were in the hospital after you are discharged, you can call the unit and asked to speak with the hospitalist on call if the hospitalist that took care of you is not available. Once you are discharged, your primary care physician will handle any further medical issues. Please note that NO REFILLS for any discharge medications will be authorized once you are discharged, as it is imperative that you return to your primary care physician (or establish a relationship with a primary care physician if you do not have one) for your aftercare needs so that they can reassess your need for medications and monitor your lab values.  You can reach the hospitalist office at phone [phone] or fax [phone]   If you do not have a primary care physician, you can call [phone] for a physician referral.

## 2020-10-20 NOTE — Care Management Important Message (Signed)
Important Message  Patient Details  Name: Aaron [name] MRN: [id number] Date of Birth: [birth year]   Medicare Important Message Given:  Yes  Patient left prior to IM delivery will mail to home address.    [name] 10/20/2020, 3:27 PM

## 2020-10-20 NOTE — Assessment & Plan Note (Addendum)
--   9/10, operative intervention delayed by scheduling issues.  S/p operative intervention 9/14.   - see hip fx

## 2020-10-20 NOTE — Op Note (Signed)
OPERATIVE REPORT   10/20/2020  3:04 PM  PATIENT:  Aaron [name]   SURGEON:  Jonette Pesa, MD  ASSISTANT:  Skip Mayer, PA-C. Winifred Olive, RNFA.   PREOPERATIVE DIAGNOSIS:  Right [place] B1 periprosthetic femur fracture.  POSTOPERATIVE DIAGNOSIS:  Same.  PROCEDURE:  1.  Open reduction internal fixation right femur. 2.  Interpretation of fluoroscopic images   ANESTHESIA:   GETA.   ANTIBIOTICS: 2 g Ancef.   IMPLANTS: Zimmer NCB periprosthetic proximal femur locking plate, left, 12 holes, with wide trochanter plate attachment. 5.0 mm distal interlocking screw x3 with locking caps. 4.0 mm distal interlocking screw x1. 3.5 mm proximal unicortical locking screw x3. 4.0 proximal bicortical screw with locking caps x2. Cable button with adult recon cable x5. 18-gauge stainless steel wire x4.   SPECIMENS: None.   COMPLICATIONS: None.   DISPOSITION: Stable to PACU.   SURGICAL INDICATIONS:  [name] is a 80 y.o. male with a diagnosis of Right periprosthetic femur fracture after sustaining a ground level fall. He has a previous anterior approach right total hip arthroplasty that has been well.  He had immediate right hip pain and deformity.  He was unable to weight-bear.  He was brought to the emergency department, where x-rays and CT scan revealed a [place] B1 periprosthetic femur fracture.  He was admitted to the hospitalist service for perioperative risk stratification and medical optimization.  Hemoglobin on presentation was less than 9, so he was given 2 units of packed red blood cells with good response.  He was indicated for ORIF of the right femur.  The risks, benefits, and alternatives were discussed with the patient preoperatively including but not limited to the risks of infection, bleeding, nerve / blood vessel injury, malunion, nonunion, cardiopulmonary complications, the need for repeat surgery, among others, and the patient was willing  to proceed.  PROCEDURE IN DETAIL: The patient was identified in the holding area using 2 identifiers.  The surgical site was marked by myself.  He was taken to the operating room, placed supine on the operating room table.  General anesthesia was induced.  Foley catheter was placed.  He was flipped to the left lateral decubitus position.  Axillary roll was placed.  He was secured to the table with a beanbag positioner.  All bony prominences were well-padded.  The right hip and lower extremity were prepped and draped in the normal sterile surgical fashion.  Timeout was called, verifying site and site of surgery.  He did receive IV antibiotics within 60 minutes of beginning the procedure.  I brought in fluoroscopy to define the anatomy.  A longitudinal incision was created over the lateral aspect of the proximal femur exposing the entire length of the fracture.  The IT band was split in line with fibers.  Retractors were placed.  The vastus lateralis was peeled off the intermuscular septum and retracted anteriorly.  The fracture was identified.  The fracture was located around the tip of the stem.  Fracture hematoma was cleared with a curette and irrigated with pulse lavage.  The fracture was reduced with traction and rotation.  A lobster claw was used to hold the reduction.  4 18-gauge stainless steel wires were doubled and passed subperiosteally to hold the fracture reduction.  The wires were tightened, clipped, and the excess wire was tamped anteriorly with a bone tamp.  At this point, AP and lateral fluoroscopy views were obtained to confirm anatomic reduction of the fracture.  I then selected a  12 hole periprosthetic femur plate.  The trochanteric attachment was applied to the plate.  The plate was slid into place submuscularly and held proximally with a K wire and distally with a drill bit.  AP and lateral fluoroscopy views were used to confirm placement of the plate.  The plate was compressed to bone  proximally with a subperiosteal adult reconstruction cable placed through a cable button.  The plate was secured distally with a total of 4 bicortical interlocking screws.  The distalmost screw was a 4.0 millimeter screw.  The 3 screws above this were 5.0 mm interlocking screws.  Locking caps were applied to the 5.0 millimeter screws.   I then returned my attention proximally.  The jig was removed.  I drilled for 2 bicortical screws in the proximal aspect of the locking plate.  1 screw was anterior to the stem, and one screw was posterior to the stem.  The screws were tightened and locking caps were placed.  I then placed 3 more 3.5 mm unicortical locking screws proximally.  I placed 4 additional adult reconstruction cables through cable buttons subperiosteally.  I was very pleased with the fixation.  I then used final AP and lateral fluoroscopy views to confirm fracture reduction and hardware placement.  The wound was then copiously irrigated with Irrisept solution followed by 3 L of normal saline with pulse lavage.  1 g of vancomycin powder was applied deep to the IT band.  The IT band was then closed with a combination of #1 Vicryl sutures and #1 strata fix.  The deep fatty tissue was closed with 2-0 Vicryl.  Deep dermal layer was closed with 2-0 Monocryl interrupted sutures.  Skin was reapproximated with staples.  Dermabond was applied to the skin.  Once the glue was dried, an Aquacel Ag dressing was placed.     The patient was then flipped supine, awakened from anesthesia, and taken to the PACU in stable condition.  Sponge, needle, and instrument counts were correct at the end of the case x2.  There were no known complications.   POSTOPERATIVE PLAN: Postoperatively, the patient will be readmitted to the hospitalist.  30% weightbearing right lower extremity with a walker.  Standard IV antibiotic prophylaxis.  Tomorrow morning, start Lovenox for DVT prophylaxis, and discharge home on aspirin 81 mg p.o.  twice daily.  Mobilize out of bed with PT/OT.  Patient will undergo disposition planning.  Return to the office in 2 weeks for routine radiographs of the left femur and suture removal.
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
## 2020-10-20 NOTE — Assessment & Plan Note (Signed)
--  stable, continue statin

## 2020-10-20 NOTE — Assessment & Plan Note (Addendum)
-   hold lisinopril due to orthostatic hypotension

## 2020-10-20 NOTE — Progress Notes (Signed)
[facility] contacted to transfer pt to [facility] short stay 2 between 9-10a.

## 2020-10-20 NOTE — Assessment & Plan Note (Addendum)
-   Mildly dehydrated initially on presentation - Started on IV fluids.  - see ortho hypoTN

## 2020-10-20 NOTE — Assessment & Plan Note (Addendum)
--   Secondary to hip fracture.  Hemoglobin appropriately increased with 2 units PRBC and stable. - Continue trending hemoglobin; stabilizing  -Some drop today, no signs of bleeding, may be hemodilution as has been on fluids - follow up Hgb in am

## 2020-10-20 NOTE — Interval H&P Note (Signed)
History and Physical Interval Note:  10/20/2020 11:17 AM  [name]  has presented today for surgery, with the diagnosis of Right periprosthetic femur fracture.  The various methods of treatment have been discussed with the patient and family. After consideration of risks, benefits and other options for treatment, the patient has consented to  Procedure(s): OPEN REDUCTION INTERNAL FIXATION OF PERIPROSTHETIC FEMUR FRACTURE (Right) as a surgical intervention.  The patient's history has been reviewed, patient examined, no change in status, stable for surgery.  I have reviewed the patient's chart and labs.  Questions were answered to the patient's satisfaction.    The risks, benefits, and alternatives were discussed with the patient. There are risks associated with the surgery including, but not limited to, problems with anesthesia (death), infection, differences in leg length/angulation/rotation, fracture of bones, loosening or failure of implants, malunion, nonunion, hematoma (blood accumulation) which may require surgical drainage, blood clots, pulmonary embolism, nerve injury (foot drop), and blood vessel injury. The patient understands these risks and elects to proceed.    Iline Oven Rudie Rikard

## 2020-10-20 NOTE — Anesthesia Procedure Notes (Signed)
Procedure Name: Intubation Date/Time: 10/20/2020 12:00 PM Performed by: Vanessa Covel, CRNA Pre-anesthesia Checklist: Patient identified, Emergency Drugs available, Suction available and Patient being monitored Patient Re-evaluated:Patient Re-evaluated prior to induction Oxygen Delivery Method: Circle system utilized Preoxygenation: Pre-oxygenation with 100% oxygen Induction Type: IV induction Ventilation: Mask ventilation without difficulty Laryngoscope Size: 2 and Miller Grade View: Grade II Tube type: Oral Tube size: 7.5 mm Number of attempts: 1 Airway Equipment and Method: Stylet Placement Confirmation: ETT inserted through vocal cords under direct vision, positive ETCO2 and breath sounds checked- equal and bilateral Secured at: 22 cm Tube secured with: Tape Dental Injury: Teeth and Oropharynx as per pre-operative assessment  Difficulty Due To: Difficult Airway- due to anterior larynx

## 2020-10-21 DIAGNOSIS — M978XXA Periprosthetic fracture around other internal prosthetic joint, initial encounter: Secondary | ICD-10-CM | POA: Diagnosis not present

## 2020-10-21 DIAGNOSIS — Z96649 Presence of unspecified artificial hip joint: Secondary | ICD-10-CM | POA: Diagnosis not present

## 2020-10-21 DIAGNOSIS — L8990 Pressure ulcer of unspecified site, unspecified stage: Secondary | ICD-10-CM | POA: Insufficient documentation

## 2020-10-21 DIAGNOSIS — E871 Hypo-osmolality and hyponatremia: Secondary | ICD-10-CM | POA: Diagnosis not present

## 2020-10-21 DIAGNOSIS — D62 Acute posthemorrhagic anemia: Secondary | ICD-10-CM | POA: Diagnosis not present

## 2020-10-21 LAB — CBC
HCT: 29.9 % — ABNORMAL LOW (ref 39.0–52.0)
Hemoglobin: 9.8 g/dL — ABNORMAL LOW (ref 13.0–17.0)
MCH: 28.7 pg (ref 26.0–34.0)
MCHC: 32.8 g/dL (ref 30.0–36.0)
MCV: 87.4 fL (ref 80.0–100.0)
Platelets: 211 10*3/uL (ref 150–400)
RBC: 3.42 MIL/uL — ABNORMAL LOW (ref 4.22–5.81)
RDW: 16.2 % — ABNORMAL HIGH (ref 11.5–15.5)
WBC: 12.2 10*3/uL — ABNORMAL HIGH (ref 4.0–10.5)
nRBC: 0 % (ref 0.0–0.2)

## 2020-10-21 LAB — BASIC METABOLIC PANEL
Anion gap: 9 (ref 5–15)
Anion gap: 6 (ref 5–15)
BUN: 16 mg/dL (ref 8–23)
BUN: 17 mg/dL (ref 8–23)
CO2: 23 mmol/L (ref 22–32)
CO2: 26 mmol/L (ref 22–32)
Calcium: 7.8 mg/dL — ABNORMAL LOW (ref 8.9–10.3)
Calcium: 8.1 mg/dL — ABNORMAL LOW (ref 8.9–10.3)
Chloride: 94 mmol/L — ABNORMAL LOW (ref 98–111)
Chloride: 102 mmol/L (ref 98–111)
Creatinine, Ser: 0.61 mg/dL (ref 0.61–1.24)
Creatinine, Ser: 0.69 mg/dL (ref 0.61–1.24)
GFR, Estimated: >60 mL/min (ref >60)
GFR, Estimated: >60 mL/min (ref >60)
Glucose, Bld: 134 mg/dL — ABNORMAL HIGH (ref 70–99)
Glucose, Bld: 149 mg/dL — ABNORMAL HIGH (ref 70–99)
Potassium: 4.6 mmol/L (ref 3.5–5.1)
Potassium: 4.6 mmol/L (ref 3.5–5.1)
Sodium: 126 mmol/L — ABNORMAL LOW (ref 135–145)
Sodium: 134 mmol/L — ABNORMAL LOW (ref 135–145)

## 2020-10-21 LAB — PHENYTOIN LEVEL, TOTAL: Phenytoin Lvl: 16 ug/mL (ref 10.0–20.0)

## 2020-10-21 MED ORDER — LISINOPRIL 10 MG PO TABS
10.0000 mg | ORAL_TABLET | Freq: Every day | ORAL | Status: DC
  Administered 2020-10-21 – 2020-10-22 (×2): 10 mg via ORAL
  Filled 2020-10-21 (×2): qty 1
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
## 2020-10-21 NOTE — Plan of Care (Signed)
  Problem: Education: Goal: Knowledge of General Education information will improve Description: Including pain rating scale, medication(s)/side effects and non-pharmacologic comfort measures Outcome: Completed/Met   Problem: Health Behavior/Discharge Planning: Goal: Ability to manage health-related needs will improve Outcome: Completed/Met   Problem: Activity: Goal: Risk for activity intolerance will decrease Outcome: Completed/Met

## 2020-10-21 NOTE — Anesthesia Postprocedure Evaluation (Signed)
Anesthesia Post Note  Patient: Aaron [name]  Procedure(s) Performed: OPEN REDUCTION INTERNAL FIXATION OF PERIPROSTHETIC FEMUR FRACTURE (Right)     Patient location during evaluation: PACU Anesthesia Type: General Level of consciousness: awake and alert Pain management: pain level controlled Vital Signs Assessment: post-procedure vital signs reviewed and stable Respiratory status: spontaneous breathing, nonlabored ventilation, respiratory function stable and patient connected to nasal cannula oxygen Cardiovascular status: blood pressure returned to baseline and stable Postop Assessment: no apparent nausea or vomiting Anesthetic complications: no   No notable events documented.  Last Vitals:  Vitals:   10/20/20 1729 10/20/20 2109  BP: (!) 143/78 131/83  Pulse: 84 84  Resp:  19  Temp: 36.8 C (!) 36.3 C  SpO2: 100% 97%    Last Pain:  Vitals:   10/20/20 2109  TempSrc: Oral  PainSc:                  Nelle Don Julius Matus
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
## 2020-10-21 NOTE — Plan of Care (Signed)
  Problem: Coping: Goal: Level of anxiety will decrease Outcome: Progressing   Problem: Pain Managment: Goal: General experience of comfort will improve Outcome: Progressing   Problem: Skin Integrity: Goal: Risk for impaired skin integrity will decrease Outcome: Progressing   
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
## 2020-10-21 NOTE — Assessment & Plan Note (Addendum)
-   s/p mechanical fall at home -Underwent ORIF on 10/20/2020 - Follow-up PT evaluation - Aspirin 81 mg BID x 6 weeks for DVT ppx per ortho - Continue pain control

## 2020-10-21 NOTE — Assessment & Plan Note (Signed)
-   see ABLA

## 2020-10-21 NOTE — Progress Notes (Addendum)
RN removed foley this morning. RN recommended pt to sit in the side of the bed. Pt refuse d/t increase pain in the operative side. Will continue to monitor.

## 2020-10-22 DIAGNOSIS — M978XXA Periprosthetic fracture around other internal prosthetic joint, initial encounter: Secondary | ICD-10-CM | POA: Diagnosis not present

## 2020-10-22 DIAGNOSIS — Z96649 Presence of unspecified artificial hip joint: Secondary | ICD-10-CM | POA: Diagnosis not present

## 2020-10-22 DIAGNOSIS — I951 Orthostatic hypotension: Secondary | ICD-10-CM

## 2020-10-22 LAB — BASIC METABOLIC PANEL
Anion gap: 5 (ref 5–15)
BUN: 17 mg/dL (ref 8–23)
CO2: 26 mmol/L (ref 22–32)
Calcium: 7.9 mg/dL — ABNORMAL LOW (ref 8.9–10.3)
Chloride: 99 mmol/L (ref 98–111)
Creatinine, Ser: 0.64 mg/dL (ref 0.61–1.24)
GFR, Estimated: >60 mL/min (ref >60)
Glucose, Bld: 116 mg/dL — ABNORMAL HIGH (ref 70–99)
Potassium: 4 mmol/L (ref 3.5–5.1)
Sodium: 130 mmol/L — ABNORMAL LOW (ref 135–145)

## 2020-10-22 LAB — CBC
HCT: 24.4 % — ABNORMAL LOW (ref 39.0–52.0)
Hemoglobin: 8.1 g/dL — ABNORMAL LOW (ref 13.0–17.0)
MCH: 28.9 pg (ref 26.0–34.0)
MCHC: 33.2 g/dL (ref 30.0–36.0)
MCV: 87.1 fL (ref 80.0–100.0)
Platelets: 198 10*3/uL (ref 150–400)
RBC: 2.8 MIL/uL — ABNORMAL LOW (ref 4.22–5.81)
RDW: 16.1 % — ABNORMAL HIGH (ref 11.5–15.5)
WBC: 10.8 10*3/uL — ABNORMAL HIGH (ref 4.0–10.5)
nRBC: 0 % (ref 0.0–0.2)

## 2020-10-22 LAB — MAGNESIUM: Magnesium: 2 mg/dL (ref 1.7–2.4)

## 2020-10-22 MED ORDER — HYDROCODONE-ACETAMINOPHEN 5-325 MG PO TABS: 1.0000 | ORAL_TABLET | ORAL | 0 refills | Status: DC | PRN

## 2020-10-22 MED ORDER — ASPIRIN 81 MG PO CHEW: 81.0000 mg | CHEWABLE_TABLET | Freq: Two times a day (BID) | ORAL | 0 refills | Status: AC

## 2020-10-22 MED ORDER — SODIUM CHLORIDE 0.9 % IV SOLN: INTRAVENOUS | Status: DC
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
## 2020-10-22 NOTE — Plan of Care (Signed)
  Problem: Nutrition: Goal: Adequate nutrition will be maintained Outcome: Progressing   Problem: Coping: Goal: Level of anxiety will decrease Outcome: Progressing   Problem: Pain Managment: Goal: General experience of comfort will improve Outcome: Progressing   Problem: Clinical Measurements: Goal: Ability to maintain clinical measurements within normal limits will improve Outcome: Completed/Met Goal: Respiratory complications will improve Outcome: Completed/Met Goal: Cardiovascular complication will be avoided Outcome: Completed/Met   Problem: Elimination: Goal: Will not experience complications related to urinary retention Outcome: Completed/Met

## 2020-10-22 NOTE — Assessment & Plan Note (Addendum)
-   Noted that patient became hypotensive 9/16 when sitting with physical therapy - BP still not recovering much, remains orthostatic and mildly symptomatic on 9/19 repeat check - no overt bleeding; Hgb stable (some drop today noted but some hemodilution expected too) and RLE compartment stable/soft (expected edema postop) - continue IVF - adding midodrine - once BP stable, he would be able to go to rehab
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
## 2020-10-23 DIAGNOSIS — I951 Orthostatic hypotension: Secondary | ICD-10-CM

## 2020-10-23 DIAGNOSIS — D62 Acute posthemorrhagic anemia: Secondary | ICD-10-CM | POA: Diagnosis not present

## 2020-10-23 DIAGNOSIS — M978XXA Periprosthetic fracture around other internal prosthetic joint, initial encounter: Secondary | ICD-10-CM | POA: Diagnosis not present

## 2020-10-23 DIAGNOSIS — Z96649 Presence of unspecified artificial hip joint: Secondary | ICD-10-CM | POA: Diagnosis not present

## 2020-10-23 LAB — BASIC METABOLIC PANEL
Anion gap: 7 (ref 5–15)
BUN: 17 mg/dL (ref 8–23)
CO2: 27 mmol/L (ref 22–32)
Calcium: 7.9 mg/dL — ABNORMAL LOW (ref 8.9–10.3)
Chloride: 101 mmol/L (ref 98–111)
Creatinine, Ser: 0.45 mg/dL — ABNORMAL LOW (ref 0.61–1.24)
GFR, Estimated: >60 mL/min (ref >60)
Glucose, Bld: 122 mg/dL — ABNORMAL HIGH (ref 70–99)
Potassium: 3.8 mmol/L (ref 3.5–5.1)
Sodium: 135 mmol/L (ref 135–145)

## 2020-10-23 LAB — CBC
HCT: 25.9 % — ABNORMAL LOW (ref 39.0–52.0)
Hemoglobin: 8.5 g/dL — ABNORMAL LOW (ref 13.0–17.0)
MCH: 28.8 pg (ref 26.0–34.0)
MCHC: 32.8 g/dL (ref 30.0–36.0)
MCV: 87.8 fL (ref 80.0–100.0)
Platelets: 274 10*3/uL (ref 150–400)
RBC: 2.95 MIL/uL — ABNORMAL LOW (ref 4.22–5.81)
RDW: 16.4 % — ABNORMAL HIGH (ref 11.5–15.5)
WBC: 9.9 10*3/uL (ref 4.0–10.5)
nRBC: 0 % (ref 0.0–0.2)

## 2020-10-23 LAB — SARS CORONAVIRUS 2 (TAT 6-24 HRS): SARS Coronavirus 2: NEGATIVE

## 2020-10-23 LAB — MAGNESIUM: Magnesium: 2 mg/dL (ref 1.7–2.4)

## 2020-10-23 MED ORDER — FOLIC ACID 1 MG PO TABS
1.0000 mg | ORAL_TABLET | Freq: Every day | ORAL | Status: DC
  Administered 2020-10-23 – 2020-10-28 (×4): 1 mg via ORAL
  Filled 2020-10-23 (×6): qty 1

## 2020-10-23 MED ORDER — ASPIRIN EC 81 MG PO TBEC
81.0000 mg | DELAYED_RELEASE_TABLET | Freq: Two times a day (BID) | ORAL | Status: DC
  Administered 2020-10-23 – 2020-10-28 (×10): 81 mg via ORAL
  Filled 2020-10-23 (×10): qty 1

## 2020-10-23 NOTE — Progress Notes (Signed)
Subjective: 3 Days Post-Op Procedure(s) (LRB): OPEN REDUCTION INTERNAL FIXATION OF PERIPROSTHETIC FEMUR FRACTURE (Right) Patient reports pain as mild.  Reports minimal leg pain.  Objective: Vital signs in last 24 hours: Temp:  [98 F (36.7 C)-98.3 F (36.8 C)] 98 F (36.7 C) (09/17 0457) Pulse Rate:  [92-100] 92 (09/17 0457) Resp:  [18-19] 19 (09/17 0457) BP: (97-119)/(62-78) 119/78 (09/17 0457) SpO2:  [95 %-98 %] 96 % (09/17 0457)  Intake/Output from previous day: 09/16 0701 - 09/17 0700 In: 905.6 [P.O.:720; I.V.:185.6] Out: -  Intake/Output this shift: No intake/output data recorded.  Recent Labs    10/21/20 0447 10/22/20 0414 10/23/20 0502  HGB 9.8* 8.1* 8.5*   Recent Labs    10/22/20 0414 10/23/20 0502  WBC 10.8* 9.9  RBC 2.80* 2.95*  HCT 24.4* 25.9*  PLT 198 274   Recent Labs    10/22/20 0414 10/23/20 0502  NA 130* 135  K 4.0 3.8  CL 99 101  CO2 26 27  BUN 17 17  CREATININE 0.64 0.45*  GLUCOSE 116* 122*  CALCIUM 7.9* 7.9*   No results for input(s): LABPT, INR in the last 72 hours.  Neurologically intact ABD soft Neurovascular intact Sensation intact distally Intact pulses distally Dorsiflexion/Plantar flexion intact Incision: dressing C/D/I and no drainage No cellulitis present Compartment soft No sign of DVT   Assessment/Plan: 3 Days Post-Op Procedure(s) (LRB): OPEN REDUCTION INTERNAL FIXATION OF PERIPROSTHETIC FEMUR FRACTURE (Right) Advance diet Up with therapy D/C IV fluids TDWB RLE ASA for DVT ppx Dispo SNF when medically stable   Dorothy Spark 10/23/2020, 9:06 AM
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
## 2020-10-23 NOTE — TOC Initial Note (Signed)
Transition of Care [facility]) - Initial/Assessment Note    Patient Details  Name: Aaron [name] MRN: [id number] Date of Birth: 1940-02-08  [facility] CM/SW Contact:    Ida Rogue, LCSW Phone Number: 10/23/2020, 1:11 PM  Clinical Narrative:   Patient seen in follow up to PT recommendation of SNF.  Mr Bergen states he knows he needs that level of help to return to baseline.  He lives in [place] with son, with whom he had been going to work daily.  They live in [place], and Mr Wehrly is hoping to go to a facility in [place].  Bed search and insurance authorization process explained.  Did not start authorization as we did not yet have facility. TOC will continue to follow during the course of hospitalization.                 Expected Discharge Plan: Skilled Nursing Facility Barriers to Discharge: SNF Pending bed offer   Patient Goals and CMS Choice     Choice offered to / list presented to : Patient  Expected Discharge Plan and Services Expected Discharge Plan: Skilled Nursing Facility In-house Referral: Clinical Social Work Discharge Planning Services: CM Consult Post Acute Care Choice: Skilled Nursing Facility Living arrangements for the past 2 months: Single Family Home                                      Prior Living Arrangements/Services Living arrangements for the past 2 months: Single Family Home Lives with:: Adult Children Patient language and need for interpreter reviewed:: Yes        Need for Family Participation in Patient Care: Yes (Comment) Care giver support system in place?: Yes (comment)   Criminal Activity/Legal Involvement Pertinent to Current Situation/Hospitalization: No - Comment as needed  Activities of Daily Living Home Assistive Devices/Equipment: Dan Humphreys (specify type) ADL Screening (condition at time of admission) Patient's cognitive ability adequate to safely complete daily activities?: Yes Is the patient deaf or have  difficulty hearing?: No Does the patient have difficulty seeing, even when wearing glasses/contacts?: No Does the patient have difficulty concentrating, remembering, or making decisions?: Yes Patient able to express need for assistance with ADLs?: Yes Does the patient have difficulty dressing or bathing?: Yes Independently performs ADLs?: No Communication: Appropriate for developmental age Dressing (OT): Needs assistance Is this a change from baseline?: Pre-admission baseline Grooming: Needs assistance Is this a change from baseline?: Pre-admission baseline Feeding: Appropriate for developmental age Bathing: Needs assistance Is this a change from baseline?: Pre-admission baseline Toileting: Needs assistance Is this a change from baseline?: Pre-admission baseline In/Out Bed: Needs assistance Is this a change from baseline?: Pre-admission baseline Walks in Home: Independent with device (comment) Does the patient have difficulty walking or climbing stairs?: Yes Weakness of Legs: Right Weakness of Arms/Hands: None  Permission Sought/Granted                  Emotional Assessment Appearance:: Appears older than stated age Attitude/Demeanor/Rapport: Engaged Affect (typically observed): Appropriate Orientation: : Oriented to Self, Oriented to Place, Oriented to  Time, Oriented to Situation Alcohol / Substance Use: Not Applicable Psych Involvement: No (comment)  Admission diagnosis:  Pain [R52] Pre-op evaluation [Z01.818] Closed displaced comminuted fracture of shaft of right femur, initial encounter (HCC) [S72.351A] Fall, initial encounter [W19.XXXA] Periprosthetic hip fracture, initial encounter [N40.8XXA, Z96.649] Patient Active Problem List   Diagnosis Date Noted  Orthostatic hypotension 10/22/2020   Pressure injury of skin 10/21/2020   Hip fracture, right (HCC) 10/20/2020   ABLA 10/20/2020   Seizure disorder (HCC) 10/20/2020   Vitamin B 12 deficiency 10/20/2020    Hyponatremia 10/20/2020   Hyperlipidemia 10/20/2020   Malnutrition of moderate degree 10/19/2020   Periprosthetic hip fracture, initial encounter 10/17/2020   Normochromic normocytic anemia 10/17/2020   History of seizure 10/17/2020   Essential hypertension 10/17/2020   Closed bent bone fracture of left ulna 05/09/2017   PCP:  Eartha Inch, MD Pharmacy:   [facility] [address] [place] - [address] [facility] [place] [address] Phone: [phone] Fax: [phone]     Social Determinants of Health (SDOH) Interventions    Readmission Risk Interventions No flowsheet data found.
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
## 2020-10-24 DIAGNOSIS — Z96649 Presence of unspecified artificial hip joint: Secondary | ICD-10-CM | POA: Diagnosis not present

## 2020-10-24 DIAGNOSIS — M978XXA Periprosthetic fracture around other internal prosthetic joint, initial encounter: Secondary | ICD-10-CM | POA: Diagnosis not present

## 2020-10-24 LAB — CBC WITH DIFFERENTIAL/PLATELET
Abs Immature Granulocytes: 0.06 10*3/uL (ref 0.00–0.07)
Basophils Absolute: 0 10*3/uL (ref 0.0–0.1)
Basophils Relative: 0 %
Eosinophils Absolute: 0.1 10*3/uL (ref 0.0–0.5)
Eosinophils Relative: 2 %
HCT: 25.3 % — ABNORMAL LOW (ref 39.0–52.0)
Hemoglobin: 8.4 g/dL — ABNORMAL LOW (ref 13.0–17.0)
Immature Granulocytes: 1 %
Lymphocytes Relative: 17 %
Lymphs Abs: 1.4 10*3/uL (ref 0.7–4.0)
MCH: 29.2 pg (ref 26.0–34.0)
MCHC: 33.2 g/dL (ref 30.0–36.0)
MCV: 87.8 fL (ref 80.0–100.0)
Monocytes Absolute: 1 10*3/uL (ref 0.1–1.0)
Monocytes Relative: 13 %
Neutro Abs: 5.2 10*3/uL (ref 1.7–7.7)
Neutrophils Relative %: 67 %
Platelets: 319 10*3/uL (ref 150–400)
RBC: 2.88 MIL/uL — ABNORMAL LOW (ref 4.22–5.81)
RDW: 16.4 % — ABNORMAL HIGH (ref 11.5–15.5)
WBC: 7.8 10*3/uL (ref 4.0–10.5)
nRBC: 0 % (ref 0.0–0.2)

## 2020-10-24 LAB — BASIC METABOLIC PANEL
Anion gap: 5 (ref 5–15)
BUN: 15 mg/dL (ref 8–23)
CO2: 26 mmol/L (ref 22–32)
Calcium: 7.6 mg/dL — ABNORMAL LOW (ref 8.9–10.3)
Chloride: 100 mmol/L (ref 98–111)
Creatinine, Ser: 0.47 mg/dL — ABNORMAL LOW (ref 0.61–1.24)
GFR, Estimated: >60 mL/min (ref >60)
Glucose, Bld: 106 mg/dL — ABNORMAL HIGH (ref 70–99)
Potassium: 3.9 mmol/L (ref 3.5–5.1)
Sodium: 131 mmol/L — ABNORMAL LOW (ref 135–145)

## 2020-10-24 LAB — MAGNESIUM: Magnesium: 2 mg/dL (ref 1.7–2.4)

## 2020-10-24 NOTE — TOC Progression Note (Signed)
Transition of Care [facility]) - Progression Note    Patient Details  Name: Aaron [name] MRN: [id number] Date of Birth: [birth year]  [facility] of [facility]) CM/SW Contact  Pearson Picou, Olegario Messier, RN Phone Number: 10/24/2020, 4:10 PM  Clinical Narrative:  Provided w/bed offers to patient. Attempted leaving vm w/Son Jomarie Longs vm full.await choice. 1. 1.3 mi [facility] and [address] [place], [place] [address] [phone] Overall rating Above average 2. 1.6 mi [address] at [place], [place] [address] [place], [place] [address] [phone] Overall rating Much below average 3. 2.1 mi [facility] and [facility] [address] [place], [place] [address] [phone] Overall rating Much below average 4. 2.5 mi [facility] [place], [facility] [address] [place], [place] [address] [phone] Overall rating Below average 5. 2.8 mi [facility] at the [facility] [address] [place], [place] [address] [phone] Overall rating Above average 6. 2.8 mi [facility] and [facility] [address] [place], [place] [address] [phone] Overall rating Below average 7. 3 mi [facility] [address] [place], [place] [address] [phone] Overall rating Above average 8. 3.6 mi [facility] [address] [place], [place] [address] [phone] Overall rating Below average 9. 3.6 mi [facility] [address] [place], [place] [address] [phone] Overall rating Much below average 10. 3.9 mi [facility] [address] [place], [place] [address] [phone] Overall rating Below average 11. 4.4 mi [facility] [employer] [address] [place], [place] [address] [phone] Overall rating Much above average 12. 4.6  mi [facility] [address] [place], [place] [address] [phone] Overall rating Above average 13. 5.5 mi [facility] [address] [place], [place] [address] [phone] Overall rating Above average 14. 8.2 [facility] [address] [place], [place] [address] [phone] Overall rating Above average 15. 9 mi [facility] [address] [place], [place] [address] [phone] Overall rating Average 16. 9.1 mi [facility] [address] [place], [place] [address] [phone] Overall rating Average 17. 9.2 mi [facility] [address] [place], [place] [address] [phone] Overall rating Much above average 18. 10.8 mi [place] [facility] [address] [place], [place] [address] [phone] Overall rating Much above average 19. 12.6 mi [facility] [address] [place], [place] [address] [phone] Overall rating Much below average 20. 12.8 [facility] [address] [place], [place] [address] [phone] Overall rating Much below average 21. 14.2 mi [facility] [employer] [facility] [address] [place], [place] [address] [phone] Overall rating Much below average 22. 14.4 mi [facility] [address] [place], [place] [address] [phone] Overall rating Above average 23. 14.8 mi [facility] and [facility] [address] [place], [place] [address] [phone] Overall rating Much above average 24. 14.9 mi [facility] and [facility] [address] [place], [place] [address] [phone] Overall rating Much below average 25. 16.5 mi [facility] [address] [place], [place] [address] [phone] Overall rating Above average 26. 16.7 mi [facility] [address] [place], [place] [address] [phone] Overall rating Much above average 27. 17.9  mi [employer] [facility] [address] [place], [place] [address] [phone] Overall rating Below average 28. 18.1 [facility] [address] [place], [place] [address] [phone] Overall rating Much below average 29. 19.7 mi [facility] [address] [place], [place] [address] [phone] Overall rating Much below average 30. [address] at [employer] at [facility], [place] [address] [phone] Overall rating Much above average 31. 21.1 mi [facility] [address] [place], [place] [address] [phone] Overall rating Much below average 32. 21.6 [address] [place], [place] [address] [phone] Overall rating Below average 33. 21.6 [facility] [address] [place], [place] [address] [phone] Overall rating Below average 34. 21.8 [facility] [address] [place], [place] [address] [phone] Overall rating Much above average 35. [address] [place], [place] [address] [phone] Overall rating Much above average 36. 22.6 mi [facility] [address] [place], [place] [address] [phone] Overall rating Average 37. 22.7 mi [facility] [address] [place], [place] [address] [phone] Overall rating Much below average 38. 23.3 mi [facility] [address] [place], [place] [address] [phone] Overall rating Above average 39. 23.5 [facility] [address] [place], [place] [address] [phone] Overall rating Much below average 40. 24.1 mi [facility] [address] [place], [place] [address] [phone] Overall rating Much below average 41. 24.2 mi [facility] [address] [place], [place] [address] [phone] Overall rating Below average 42. 24.4 [facility] [address] [place], [place] [address] [phone] Overall rating Much below average 43. 24.5 mi [facility] [address] [place], [place] [address] [phone] Overall rating Above average To explore and download n     Expected Discharge Plan: Skilled Nursing Facility Barriers to Discharge: SNF Pending bed offer  Expected Discharge Plan and Services Expected Discharge Plan: Skilled Nursing Facility In-house Referral: Clinical Social Work Discharge Planning Services: CM Consult Post Acute Care Choice: Skilled Nursing Facility Living arrangements for the past 2 months: Single Family Home                                       Social Determinants of Health (SDOH) Interventions    Readmission Risk Interventions No flowsheet data found.
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
## 2020-10-24 NOTE — Progress Notes (Signed)
Orthopedics Progress Note  Subjective: Right hip pain is improved  Objective:  Vitals:   10/23/20 2029 10/24/20 0332  BP: 120/61 106/62  Pulse: 90 87  Resp: 18 17  Temp: 98.3 F (36.8 C) 97.8 F (36.6 C)  SpO2: 99% 96%    General: Awake and alert  Musculoskeletal: Right hip dressing CDI, mod swelling, foot drop on the right is baseline. Able to move the foot some and sensation intact  Lab Results  Component Value Date   WBC 7.8 10/24/2020   HGB 8.4 (L) 10/24/2020   HCT 25.3 (L) 10/24/2020   MCV 87.8 10/24/2020   PLT 319 10/24/2020       Component Value Date/Time   NA 131 (L) 10/24/2020 0454   K 3.9 10/24/2020 0454   CL 100 10/24/2020 0454   CO2 26 10/24/2020 0454   GLUCOSE 106 (H) 10/24/2020 0454   BUN 15 10/24/2020 0454   CREATININE 0.47 (L) 10/24/2020 0454   CALCIUM 7.6 (L) 10/24/2020 0454   GFRNONAA >60 10/24/2020 0454   GFRAA >60 05/09/2017 1430    No results found for: INR, PROTIME  Assessment/Plan: POD #4 s/p Procedure(s): OPEN REDUCTION INTERNAL FIXATION OF PERIPROSTHETIC FEMUR FRACTURE Stable this AM from ortho standpoint. Continue Lovenox in house, ASA 81 mg BID for discharge home once he clears PT  Viviann Spare R. Ranell Patrick, MD 10/24/2020 9:51 AM

## 2020-10-24 NOTE — Plan of Care (Signed)
  Problem: Coping: Goal: Level of anxiety will decrease Outcome: Progressing   Problem: Pain Managment: Goal: General experience of comfort will improve Outcome: Progressing   Problem: Safety: Goal: Ability to remain free from injury will improve Outcome: Progressing   Problem: Skin Integrity: Goal: Risk for impaired skin integrity will decrease Outcome: Progressing   

## 2020-10-25 DIAGNOSIS — M978XXA Periprosthetic fracture around other internal prosthetic joint, initial encounter: Secondary | ICD-10-CM | POA: Diagnosis not present

## 2020-10-25 DIAGNOSIS — Z96649 Presence of unspecified artificial hip joint: Secondary | ICD-10-CM | POA: Diagnosis not present

## 2020-10-25 DIAGNOSIS — I951 Orthostatic hypotension: Secondary | ICD-10-CM | POA: Diagnosis not present

## 2020-10-25 LAB — BASIC METABOLIC PANEL
Anion gap: 6 (ref 5–15)
BUN: 12 mg/dL (ref 8–23)
CO2: 25 mmol/L (ref 22–32)
Calcium: 7.9 mg/dL — ABNORMAL LOW (ref 8.9–10.3)
Chloride: 105 mmol/L (ref 98–111)
Creatinine, Ser: 0.42 mg/dL — ABNORMAL LOW (ref 0.61–1.24)
GFR, Estimated: >60 mL/min (ref >60)
Glucose, Bld: 108 mg/dL — ABNORMAL HIGH (ref 70–99)
Potassium: 3.8 mmol/L (ref 3.5–5.1)
Sodium: 136 mmol/L (ref 135–145)

## 2020-10-25 LAB — CBC WITH DIFFERENTIAL/PLATELET
Abs Immature Granulocytes: 0.05 10*3/uL (ref 0.00–0.07)
Basophils Absolute: 0 10*3/uL (ref 0.0–0.1)
Basophils Relative: 1 %
Eosinophils Absolute: 0.2 10*3/uL (ref 0.0–0.5)
Eosinophils Relative: 2 %
HCT: 23.7 % — ABNORMAL LOW (ref 39.0–52.0)
Hemoglobin: 7.8 g/dL — ABNORMAL LOW (ref 13.0–17.0)
Immature Granulocytes: 1 %
Lymphocytes Relative: 17 %
Lymphs Abs: 1.1 10*3/uL (ref 0.7–4.0)
MCH: 28.9 pg (ref 26.0–34.0)
MCHC: 32.9 g/dL (ref 30.0–36.0)
MCV: 87.8 fL (ref 80.0–100.0)
Monocytes Absolute: 0.9 10*3/uL (ref 0.1–1.0)
Monocytes Relative: 13 %
Neutro Abs: 4.3 10*3/uL (ref 1.7–7.7)
Neutrophils Relative %: 66 %
Platelets: 354 10*3/uL (ref 150–400)
RBC: 2.7 MIL/uL — ABNORMAL LOW (ref 4.22–5.81)
RDW: 16.6 % — ABNORMAL HIGH (ref 11.5–15.5)
WBC: 6.5 10*3/uL (ref 4.0–10.5)
nRBC: 0 % (ref 0.0–0.2)

## 2020-10-25 LAB — MAGNESIUM: Magnesium: 1.9 mg/dL (ref 1.7–2.4)

## 2020-10-25 MED ORDER — MIDODRINE HCL 5 MG PO TABS
5.0000 mg | ORAL_TABLET | Freq: Three times a day (TID) | ORAL | Status: DC
  Administered 2020-10-25 – 2020-10-27 (×7): 5 mg via ORAL
  Filled 2020-10-25 (×7): qty 1

## 2020-10-25 MED ORDER — ORAL CARE MOUTH RINSE
15.0000 mL | Freq: Two times a day (BID) | OROMUCOSAL | Status: DC
  Administered 2020-10-25 – 2020-10-28 (×6): 15 mL via OROMUCOSAL

## 2020-10-25 NOTE — Care Management Important Message (Signed)
Important Message  Patient Details IM Letter given to the Patient. Name: Aaron [name] MRN: [id number] Date of Birth: [birth year]   Medicare Important Message Given:  Yes     Caren Macadam 10/25/2020, 2:02 PM
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
## 2020-10-25 NOTE — TOC Progression Note (Signed)
Transition of Care [facility]) - Progression Note    Patient Details  Name: Aaron [name] MRN: [id number] Date of Birth: [birth year]  Transition of [facility]) CM/SW Contact  Golda Acre, RN Phone Number: 10/25/2020, 1:22 PM  Clinical Narrative:    Patient has chosen [facility] snf but will not be ready until 092022   Expected Discharge Plan: Skilled Nursing Facility Barriers to Discharge: SNF Pending bed offer  Expected Discharge Plan and Services Expected Discharge Plan: Skilled Nursing Facility In-house Referral: Clinical Social Work Discharge Planning Services: CM Consult Post Acute Care Choice: Skilled Nursing Facility Living arrangements for the past 2 months: Single Family Home                                       Social Determinants of Health (SDOH) Interventions    Readmission Risk Interventions No flowsheet data found.
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
## 2020-10-25 NOTE — Plan of Care (Signed)

## 2020-10-26 DIAGNOSIS — D62 Acute posthemorrhagic anemia: Secondary | ICD-10-CM | POA: Diagnosis not present

## 2020-10-26 DIAGNOSIS — I951 Orthostatic hypotension: Secondary | ICD-10-CM | POA: Diagnosis not present

## 2020-10-26 LAB — CBC WITH DIFFERENTIAL/PLATELET
Abs Immature Granulocytes: 0.07 10*3/uL (ref 0.00–0.07)
Basophils Absolute: 0.1 10*3/uL (ref 0.0–0.1)
Basophils Relative: 1 %
Eosinophils Absolute: 0.2 10*3/uL (ref 0.0–0.5)
Eosinophils Relative: 2 %
HCT: 26.1 % — ABNORMAL LOW (ref 39.0–52.0)
Hemoglobin: 8.5 g/dL — ABNORMAL LOW (ref 13.0–17.0)
Immature Granulocytes: 1 %
Lymphocytes Relative: 20 %
Lymphs Abs: 1.6 10*3/uL (ref 0.7–4.0)
MCH: 28.8 pg (ref 26.0–34.0)
MCHC: 32.6 g/dL (ref 30.0–36.0)
MCV: 88.5 fL (ref 80.0–100.0)
Monocytes Absolute: 1.1 10*3/uL — ABNORMAL HIGH (ref 0.1–1.0)
Monocytes Relative: 14 %
Neutro Abs: 4.8 10*3/uL (ref 1.7–7.7)
Neutrophils Relative %: 62 %
Platelets: 429 10*3/uL — ABNORMAL HIGH (ref 150–400)
RBC: 2.95 MIL/uL — ABNORMAL LOW (ref 4.22–5.81)
RDW: 17 % — ABNORMAL HIGH (ref 11.5–15.5)
WBC: 7.8 10*3/uL (ref 4.0–10.5)
nRBC: 0 % (ref 0.0–0.2)

## 2020-10-26 LAB — BASIC METABOLIC PANEL
Anion gap: 5 (ref 5–15)
BUN: 11 mg/dL (ref 8–23)
CO2: 25 mmol/L (ref 22–32)
Calcium: 8.3 mg/dL — ABNORMAL LOW (ref 8.9–10.3)
Chloride: 108 mmol/L (ref 98–111)
Creatinine, Ser: 0.47 mg/dL — ABNORMAL LOW (ref 0.61–1.24)
GFR, Estimated: >60 mL/min (ref >60)
Glucose, Bld: 103 mg/dL — ABNORMAL HIGH (ref 70–99)
Potassium: 4 mmol/L (ref 3.5–5.1)
Sodium: 138 mmol/L (ref 135–145)

## 2020-10-26 LAB — MAGNESIUM: Magnesium: 1.9 mg/dL (ref 1.7–2.4)
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
## 2020-10-26 NOTE — Plan of Care (Signed)
  Problem: Nutrition: Goal: Adequate nutrition will be maintained Outcome: Progressing   Problem: Coping: Goal: Level of anxiety will decrease Outcome: Progressing   Problem: Elimination: Goal: Will not experience complications related to bowel motility Outcome: Progressing   Problem: Pain Managment: Goal: General experience of comfort will improve Outcome: Progressing   Problem: Safety: Goal: Ability to remain free from injury will improve Outcome: Progressing   
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
## 2020-10-27 DIAGNOSIS — I951 Orthostatic hypotension: Secondary | ICD-10-CM | POA: Diagnosis not present

## 2020-10-27 DIAGNOSIS — D62 Acute posthemorrhagic anemia: Secondary | ICD-10-CM | POA: Diagnosis not present

## 2020-10-27 LAB — CBC WITH DIFFERENTIAL/PLATELET
Abs Immature Granulocytes: 0.07 10*3/uL (ref 0.00–0.07)
Basophils Absolute: 0 10*3/uL (ref 0.0–0.1)
Basophils Relative: 1 %
Eosinophils Absolute: 0.2 10*3/uL (ref 0.0–0.5)
Eosinophils Relative: 2 %
HCT: 25.1 % — ABNORMAL LOW (ref 39.0–52.0)
Hemoglobin: 8.3 g/dL — ABNORMAL LOW (ref 13.0–17.0)
Immature Granulocytes: 1 %
Lymphocytes Relative: 17 %
Lymphs Abs: 1.5 10*3/uL (ref 0.7–4.0)
MCH: 29.3 pg (ref 26.0–34.0)
MCHC: 33.1 g/dL (ref 30.0–36.0)
MCV: 88.7 fL (ref 80.0–100.0)
Monocytes Absolute: 1 10*3/uL (ref 0.1–1.0)
Monocytes Relative: 11 %
Neutro Abs: 6 10*3/uL (ref 1.7–7.7)
Neutrophils Relative %: 68 %
Platelets: 494 10*3/uL — ABNORMAL HIGH (ref 150–400)
RBC: 2.83 MIL/uL — ABNORMAL LOW (ref 4.22–5.81)
RDW: 17.3 % — ABNORMAL HIGH (ref 11.5–15.5)
WBC: 8.7 10*3/uL (ref 4.0–10.5)
nRBC: 0.2 % (ref 0.0–0.2)

## 2020-10-27 MED ORDER — ACETAMINOPHEN 325 MG PO TABS
650.0000 mg | ORAL_TABLET | Freq: Once | ORAL | Status: AC
  Administered 2020-10-27: 650 mg via ORAL
  Filled 2020-10-27: qty 2

## 2020-10-27 MED ORDER — MIDODRINE HCL 5 MG PO TABS
10.0000 mg | ORAL_TABLET | Freq: Three times a day (TID) | ORAL | Status: DC
  Administered 2020-10-27 – 2020-10-28 (×3): 10 mg via ORAL
  Filled 2020-10-27 (×3): qty 2
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
## 2020-10-27 NOTE — TOC Progression Note (Addendum)
Transition of Care [facility]) - Progression Note    Patient Details  Name: Aaron [name] MRN: [id number] Date of Birth: [birth year]  [facility] CM/SW Contact  Maalle Starrett, Olegario Messier, RN Phone Number: 10/27/2020, 2:07 PM  Clinical Narrative:  Georgena Spurling for [facility]-[employer] auth [id number] auth.  3:25p-received Berkley Harvey plan Berkley Harvey [id number] auth till 9/26.    Expected Discharge Plan: Skilled Nursing Facility Barriers to Discharge: English as a second language teacher  Expected Discharge Plan and Services Expected Discharge Plan: Skilled Nursing Facility In-house Referral: Clinical Social Work Discharge Planning Services: CM Consult Post Acute Care Choice: Skilled Nursing Facility Living arrangements for the past 2 months: Single Family Home                                       Social Determinants of Health (SDOH) Interventions    Readmission Risk Interventions No flowsheet data found.

## 2020-10-28 DIAGNOSIS — I951 Orthostatic hypotension: Secondary | ICD-10-CM | POA: Diagnosis not present

## 2020-10-28 DIAGNOSIS — M978XXS Periprosthetic fracture around other internal prosthetic joint, sequela: Secondary | ICD-10-CM | POA: Diagnosis not present

## 2020-10-28 DIAGNOSIS — Z96649 Presence of unspecified artificial hip joint: Secondary | ICD-10-CM | POA: Diagnosis not present

## 2020-10-28 DIAGNOSIS — D62 Acute posthemorrhagic anemia: Secondary | ICD-10-CM | POA: Diagnosis not present

## 2020-10-28 LAB — CBC WITH DIFFERENTIAL/PLATELET
Abs Immature Granulocytes: 0.09 10*3/uL — ABNORMAL HIGH (ref 0.00–0.07)
Basophils Absolute: 0 10*3/uL (ref 0.0–0.1)
Basophils Relative: 0 %
Eosinophils Absolute: 0.2 10*3/uL (ref 0.0–0.5)
Eosinophils Relative: 3 %
HCT: 25.8 % — ABNORMAL LOW (ref 39.0–52.0)
Hemoglobin: 8.3 g/dL — ABNORMAL LOW (ref 13.0–17.0)
Immature Granulocytes: 1 %
Lymphocytes Relative: 16 %
Lymphs Abs: 1.3 10*3/uL (ref 0.7–4.0)
MCH: 29.1 pg (ref 26.0–34.0)
MCHC: 32.2 g/dL (ref 30.0–36.0)
MCV: 90.5 fL (ref 80.0–100.0)
Monocytes Absolute: 0.9 10*3/uL (ref 0.1–1.0)
Monocytes Relative: 11 %
Neutro Abs: 5.2 10*3/uL (ref 1.7–7.7)
Neutrophils Relative %: 69 %
Platelets: 540 10*3/uL — ABNORMAL HIGH (ref 150–400)
RBC: 2.85 MIL/uL — ABNORMAL LOW (ref 4.22–5.81)
RDW: 18.5 % — ABNORMAL HIGH (ref 11.5–15.5)
WBC: 7.7 10*3/uL (ref 4.0–10.5)
nRBC: 0 % (ref 0.0–0.2)

## 2020-10-28 LAB — COMPREHENSIVE METABOLIC PANEL
ALT: 39 U/L (ref 0–44)
AST: 45 U/L — ABNORMAL HIGH (ref 15–41)
Albumin: 2.3 g/dL — ABNORMAL LOW (ref 3.5–5.0)
Alkaline Phosphatase: 133 U/L — ABNORMAL HIGH (ref 38–126)
Anion gap: 5 (ref 5–15)
BUN: 11 mg/dL (ref 8–23)
CO2: 25 mmol/L (ref 22–32)
Calcium: 8 mg/dL — ABNORMAL LOW (ref 8.9–10.3)
Chloride: 103 mmol/L (ref 98–111)
Creatinine, Ser: 0.52 mg/dL — ABNORMAL LOW (ref 0.61–1.24)
GFR, Estimated: >60 mL/min (ref >60)
Glucose, Bld: 106 mg/dL — ABNORMAL HIGH (ref 70–99)
Potassium: 4 mmol/L (ref 3.5–5.1)
Sodium: 133 mmol/L — ABNORMAL LOW (ref 135–145)
Total Bilirubin: 0.5 mg/dL (ref 0.3–1.2)
Total Protein: 5.4 g/dL — ABNORMAL LOW (ref 6.5–8.1)

## 2020-10-28 LAB — PHENYTOIN LEVEL, TOTAL: Phenytoin Lvl: 7.2 ug/mL — ABNORMAL LOW (ref 10.0–20.0)

## 2020-10-28 LAB — CORTISOL: Cortisol, Plasma: 9.4 ug/dL

## 2020-10-28 LAB — SARS CORONAVIRUS 2 (TAT 6-24 HRS): SARS Coronavirus 2: NEGATIVE

## 2020-10-28 MED ORDER — ENSURE ENLIVE PO LIQD: 237.0000 mL | Freq: Two times a day (BID) | ORAL | Status: DC

## 2020-10-28 MED ORDER — FOLIC ACID 1 MG PO TABS: 1.0000 mg | ORAL_TABLET | Freq: Every day | ORAL | Status: DC

## 2020-10-28 MED ORDER — SENNA 8.6 MG PO TABS: 1.0000 | ORAL_TABLET | Freq: Two times a day (BID) | ORAL | Status: AC

## 2020-10-28 MED ORDER — DOCUSATE SODIUM 100 MG PO CAPS: 100.0000 mg | ORAL_CAPSULE | Freq: Two times a day (BID) | ORAL | Status: DC

## 2020-10-28 MED ORDER — POLYETHYLENE GLYCOL 3350 17 G PO PACK: 17.0000 g | PACK | Freq: Every day | ORAL | Status: AC | PRN

## 2020-10-28 MED ORDER — MIDODRINE HCL 10 MG PO TABS: 10.0000 mg | ORAL_TABLET | Freq: Three times a day (TID) | ORAL | Status: DC

## 2020-10-28 MED ORDER — CYANOCOBALAMIN 1000 MCG/ML IJ SOLN: 1000.0000 ug | INTRAMUSCULAR | Status: AC
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
## 2020-10-28 NOTE — TOC Transition Note (Signed)
Transition of Care [facility]) - CM/SW Discharge Note   Patient Details  Name: Geovani Tootle MRN: [id number] Date of Birth: [birth year]  [facility]) CM/SW Contact:  Lanier Clam, RN Phone Number: 10/28/2020, 2:26 PM   Clinical Narrative:  d/c to [facility] rep [facility] aware & can accept-going to rm 206,tel# [phone] call report [phone]. [facility] called. No further CM needs.     Final next level of care: Skilled Nursing Facility Barriers to Discharge: No Barriers Identified   Patient Goals and CMS Choice     Choice offered to / list presented to : Patient  Discharge Placement PASRR number recieved: 10/27/20            Patient chooses bed at: [facility] Patient to be transferred to facility by: [facility] Name of family member notified: Jomarie Longs son [phone] Patient and family notified of of transfer: 10/28/20  Discharge Plan and Services In-house Referral: Clinical Social Work Discharge Planning Services: [employer] Consult Post Acute Care Choice: Skilled Nursing Facility                               Social Determinants of Health (SDOH) Interventions     Readmission Risk Interventions No flowsheet data found.

## 2020-10-28 NOTE — Progress Notes (Signed)
Pharmacy note regarding Phenytoin:  Using 10/28/2020 0437 Phenytoin level = 7.2 and albumin = 2.3, the calculated Phenytoin Total Drug Level = 12.86 mcg/mL which is within therapeutic range.    Selinda Eon, PharmD, BCPS Clinical Pharmacist [place] Please utilize Amion for appropriate phone number to reach the unit pharmacist [facility]) 10/28/2020 7:37 AM

## 2020-10-28 NOTE — Progress Notes (Signed)
[facility] RN provided report to Morrie Sheldon, Charity fundraiser at [employer]. All questions and concerns addressed.

## 2020-10-28 NOTE — Care Management Important Message (Signed)
Important Message  Patient Details IM Letter given to the Patient. Name: Quintin Hjort MRN: [id number] Date of Birth: [birth year]   Medicare Important Message Given:  Yes     Caren Macadam 10/28/2020, 1:32 PM

## 2020-11-02 ENCOUNTER — Encounter (HOSPITAL_COMMUNITY): Payer: Self-pay | Admitting: Orthopedic Surgery

## 2021-07-12 DIAGNOSIS — S32058A Other fracture of fifth lumbar vertebra, initial encounter for closed fracture: Secondary | ICD-10-CM | POA: Diagnosis not present

## 2021-07-12 DIAGNOSIS — M545 Low back pain, unspecified: Secondary | ICD-10-CM | POA: Diagnosis not present

## 2021-07-12 DIAGNOSIS — M8588 Other specified disorders of bone density and structure, other site: Secondary | ICD-10-CM | POA: Diagnosis not present

## 2021-07-12 DIAGNOSIS — S22080A Wedge compression fracture of T11-T12 vertebra, initial encounter for closed fracture: Secondary | ICD-10-CM | POA: Diagnosis not present

## 2021-07-18 ENCOUNTER — Emergency Department (HOSPITAL_COMMUNITY): Payer: Medicare Other

## 2021-07-18 ENCOUNTER — Inpatient Hospital Stay (HOSPITAL_COMMUNITY)
Admission: EM | Admit: 2021-07-18 | Discharge: 2021-07-22 | DRG: 544 | Disposition: A | Discharge status: Home Health | Payer: Medicare Other | Attending: Internal Medicine | Admitting: Internal Medicine

## 2021-07-18 ENCOUNTER — Other Ambulatory Visit: Payer: Self-pay

## 2021-07-18 ENCOUNTER — Encounter (HOSPITAL_COMMUNITY): Payer: Self-pay

## 2021-07-18 DIAGNOSIS — M21371 Foot drop, right foot: Secondary | ICD-10-CM | POA: Diagnosis not present

## 2021-07-18 DIAGNOSIS — M8588 Other specified disorders of bone density and structure, other site: Secondary | ICD-10-CM | POA: Diagnosis not present

## 2021-07-18 DIAGNOSIS — K449 Diaphragmatic hernia without obstruction or gangrene: Secondary | ICD-10-CM | POA: Diagnosis not present

## 2021-07-18 DIAGNOSIS — S22070A Wedge compression fracture of T9-T10 vertebra, initial encounter for closed fracture: Secondary | ICD-10-CM | POA: Diagnosis not present

## 2021-07-18 DIAGNOSIS — S0990XA Unspecified injury of head, initial encounter: Secondary | ICD-10-CM | POA: Diagnosis not present

## 2021-07-18 DIAGNOSIS — Z981 Arthrodesis status: Secondary | ICD-10-CM | POA: Diagnosis not present

## 2021-07-18 DIAGNOSIS — Z7982 Long term (current) use of aspirin: Secondary | ICD-10-CM

## 2021-07-18 DIAGNOSIS — I119 Hypertensive heart disease without heart failure: Secondary | ICD-10-CM | POA: Diagnosis present

## 2021-07-18 DIAGNOSIS — R296 Repeated falls: Secondary | ICD-10-CM | POA: Diagnosis not present

## 2021-07-18 DIAGNOSIS — Z9181 History of falling: Secondary | ICD-10-CM

## 2021-07-18 DIAGNOSIS — I3139 Other pericardial effusion (noninflammatory): Secondary | ICD-10-CM | POA: Diagnosis not present

## 2021-07-18 DIAGNOSIS — Z66 Do not resuscitate: Secondary | ICD-10-CM | POA: Diagnosis present

## 2021-07-18 DIAGNOSIS — I708 Atherosclerosis of other arteries: Secondary | ICD-10-CM | POA: Diagnosis not present

## 2021-07-18 DIAGNOSIS — E559 Vitamin D deficiency, unspecified: Secondary | ICD-10-CM

## 2021-07-18 DIAGNOSIS — S0190XA Unspecified open wound of unspecified part of head, initial encounter: Secondary | ICD-10-CM | POA: Diagnosis not present

## 2021-07-18 DIAGNOSIS — S22080A Wedge compression fracture of T11-T12 vertebra, initial encounter for closed fracture: Secondary | ICD-10-CM | POA: Diagnosis present

## 2021-07-18 DIAGNOSIS — E538 Deficiency of other specified B group vitamins: Secondary | ICD-10-CM | POA: Diagnosis not present

## 2021-07-18 DIAGNOSIS — M4316 Spondylolisthesis, lumbar region: Secondary | ICD-10-CM | POA: Diagnosis not present

## 2021-07-18 DIAGNOSIS — Z96641 Presence of right artificial hip joint: Secondary | ICD-10-CM | POA: Diagnosis present

## 2021-07-18 DIAGNOSIS — J8410 Pulmonary fibrosis, unspecified: Secondary | ICD-10-CM | POA: Diagnosis not present

## 2021-07-18 DIAGNOSIS — Z79899 Other long term (current) drug therapy: Secondary | ICD-10-CM | POA: Diagnosis not present

## 2021-07-18 DIAGNOSIS — M8008XA Age-related osteoporosis with current pathological fracture, vertebra(e), initial encounter for fracture: Secondary | ICD-10-CM | POA: Diagnosis not present

## 2021-07-18 DIAGNOSIS — M810 Age-related osteoporosis without current pathological fracture: Secondary | ICD-10-CM

## 2021-07-18 DIAGNOSIS — Z87898 Personal history of other specified conditions: Secondary | ICD-10-CM

## 2021-07-18 DIAGNOSIS — I2510 Atherosclerotic heart disease of native coronary artery without angina pectoris: Secondary | ICD-10-CM | POA: Diagnosis not present

## 2021-07-18 DIAGNOSIS — K5900 Constipation, unspecified: Secondary | ICD-10-CM | POA: Diagnosis present

## 2021-07-18 DIAGNOSIS — G40909 Epilepsy, unspecified, not intractable, without status epilepticus: Secondary | ICD-10-CM | POA: Diagnosis present

## 2021-07-18 DIAGNOSIS — I701 Atherosclerosis of renal artery: Secondary | ICD-10-CM | POA: Diagnosis not present

## 2021-07-18 DIAGNOSIS — E785 Hyperlipidemia, unspecified: Secondary | ICD-10-CM | POA: Diagnosis not present

## 2021-07-18 DIAGNOSIS — R9431 Abnormal electrocardiogram [ECG] [EKG]: Secondary | ICD-10-CM | POA: Diagnosis not present

## 2021-07-18 DIAGNOSIS — S32050A Wedge compression fracture of fifth lumbar vertebra, initial encounter for closed fracture: Secondary | ICD-10-CM | POA: Diagnosis not present

## 2021-07-18 DIAGNOSIS — R739 Hyperglycemia, unspecified: Secondary | ICD-10-CM | POA: Diagnosis present

## 2021-07-18 DIAGNOSIS — I4510 Unspecified right bundle-branch block: Secondary | ICD-10-CM | POA: Diagnosis not present

## 2021-07-18 DIAGNOSIS — I69398 Other sequelae of cerebral infarction: Secondary | ICD-10-CM

## 2021-07-18 DIAGNOSIS — M8000XA Age-related osteoporosis with current pathological fracture, unspecified site, initial encounter for fracture: Secondary | ICD-10-CM | POA: Diagnosis not present

## 2021-07-18 DIAGNOSIS — R531 Weakness: Secondary | ICD-10-CM | POA: Diagnosis not present

## 2021-07-18 DIAGNOSIS — I700 Atherosclerosis of aorta: Secondary | ICD-10-CM | POA: Diagnosis present

## 2021-07-18 DIAGNOSIS — W19XXXA Unspecified fall, initial encounter: Secondary | ICD-10-CM | POA: Diagnosis not present

## 2021-07-18 DIAGNOSIS — G9389 Other specified disorders of brain: Secondary | ICD-10-CM | POA: Diagnosis not present

## 2021-07-18 LAB — COMPREHENSIVE METABOLIC PANEL
ALT: 23 U/L (ref 0–44)
AST: 29 U/L (ref 15–41)
Albumin: 3.7 g/dL (ref 3.5–5.0)
Alkaline Phosphatase: 210 U/L — ABNORMAL HIGH (ref 38–126)
Anion gap: 9 (ref 5–15)
BUN: 15 mg/dL (ref 8–23)
CO2: 26 mmol/L (ref 22–32)
Calcium: 8.7 mg/dL — ABNORMAL LOW (ref 8.9–10.3)
Chloride: 100 mmol/L (ref 98–111)
Creatinine, Ser: 0.78 mg/dL (ref 0.61–1.24)
GFR, Estimated: >60 mL/min (ref >60)
Glucose, Bld: 133 mg/dL — ABNORMAL HIGH (ref 70–99)
Potassium: 4.4 mmol/L (ref 3.5–5.1)
Sodium: 135 mmol/L (ref 135–145)
Total Bilirubin: 0.7 mg/dL (ref 0.3–1.2)
Total Protein: 7.6 g/dL (ref 6.5–8.1)

## 2021-07-18 LAB — URINALYSIS, ROUTINE W REFLEX MICROSCOPIC
Bilirubin Urine: NEGATIVE
Glucose, UA: NEGATIVE mg/dL
Hgb urine dipstick: NEGATIVE
Ketones, ur: NEGATIVE mg/dL
Leukocytes,Ua: NEGATIVE
Nitrite: NEGATIVE
Protein, ur: NEGATIVE mg/dL
Specific Gravity, Urine: 1.012 (ref 1.005–1.030)
pH: 5 (ref 5.0–8.0)

## 2021-07-18 LAB — CBC WITH DIFFERENTIAL/PLATELET
Abs Immature Granulocytes: 0.03 10*3/uL (ref 0.00–0.07)
Basophils Absolute: 0 10*3/uL (ref 0.0–0.1)
Basophils Relative: 0 %
Eosinophils Absolute: 0.1 10*3/uL (ref 0.0–0.5)
Eosinophils Relative: 1 %
HCT: 39.8 % (ref 39.0–52.0)
Hemoglobin: 12.8 g/dL — ABNORMAL LOW (ref 13.0–17.0)
Immature Granulocytes: 0 %
Lymphocytes Relative: 15 %
Lymphs Abs: 1.6 10*3/uL (ref 0.7–4.0)
MCH: 29.2 pg (ref 26.0–34.0)
MCHC: 32.2 g/dL (ref 30.0–36.0)
MCV: 90.9 fL (ref 80.0–100.0)
Monocytes Absolute: 0.9 10*3/uL (ref 0.1–1.0)
Monocytes Relative: 9 %
Neutro Abs: 7.4 10*3/uL (ref 1.7–7.7)
Neutrophils Relative %: 75 %
Platelets: 352 10*3/uL (ref 150–400)
RBC: 4.38 MIL/uL (ref 4.22–5.81)
RDW: 14.8 % (ref 11.5–15.5)
WBC: 10 10*3/uL (ref 4.0–10.5)
nRBC: 0 % (ref 0.0–0.2)

## 2021-07-18 LAB — LIPASE, BLOOD: Lipase: 31 U/L (ref 11–51)

## 2021-07-18 IMAGING — CT CT ANGIO AOBIFEM WO/W CM
1 of 7 series · 5 of 16 positions shown, 7 images · non-contrast
Comparison: CT lumbar spine no contrast [DATE].

CLINICAL DATA: Claudication or leg ischemia.  Site not specified.

EXAM:
CT ANGIOGRAPHY OF ABDOMINAL AORTA WITH ILIOFEMORAL RUNOFF
TECHNIQUE: Multidetector CT imaging of the abdomen, pelvis and lower
extremities was performed using the standard protocol during bolus
administration of intravenous contrast. Multiplanar CT image
reconstructions and MIPs were obtained to evaluate the vascular
anatomy.

[Series 6: cta runoff (id) · axial · 0.98mm/px · z∈[+162,+1059]mm · 5 of 449 slices shown, 7 images]
[im 75/449  soft-tissue]
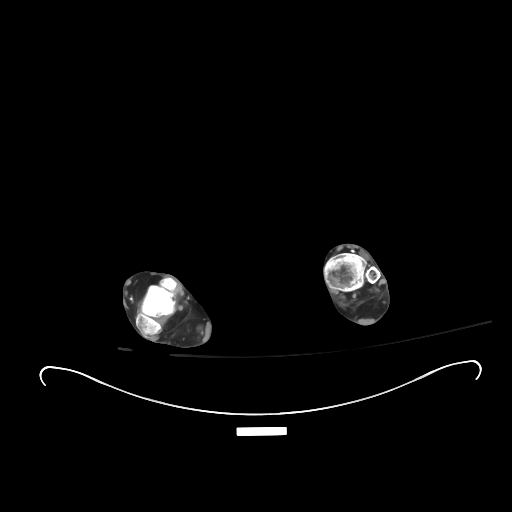
[im 75/449  bone]
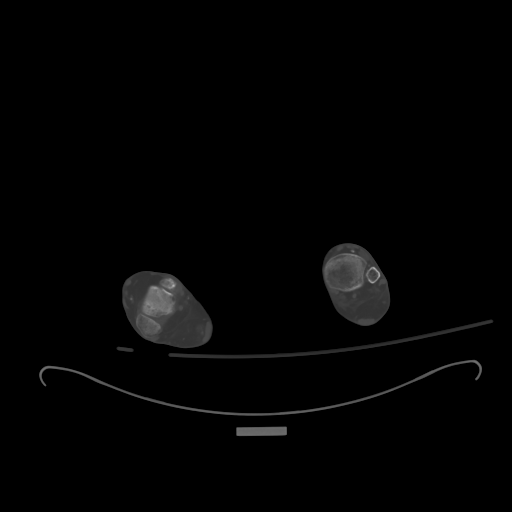
[im 150/449  soft-tissue]
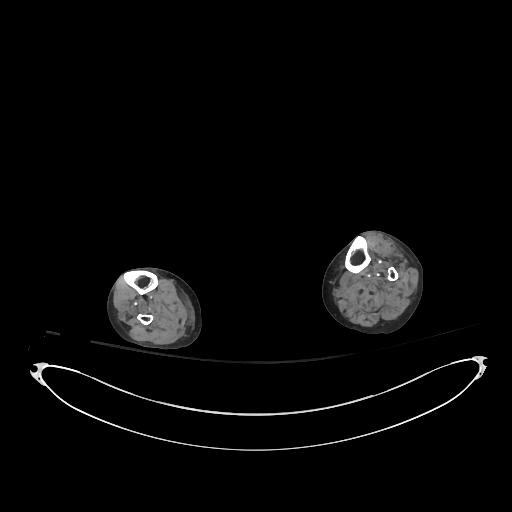
[im 225/449  soft-tissue]
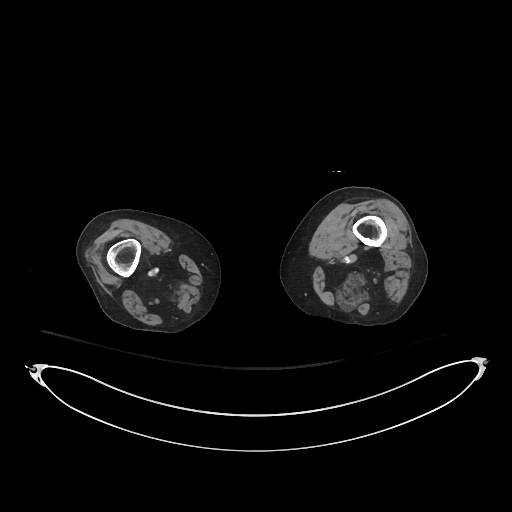
[im 299/449  soft-tissue]
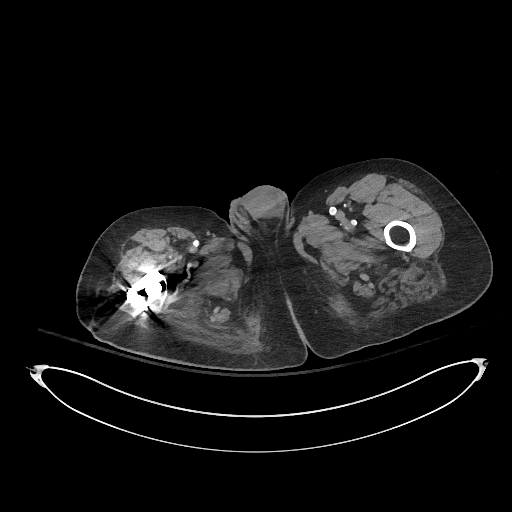
[im 374/449  soft-tissue]
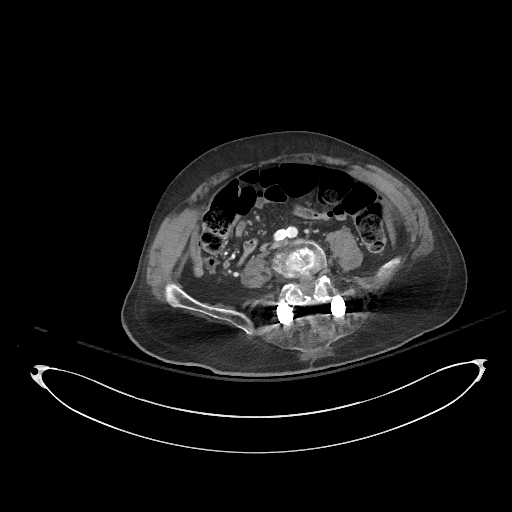
[im 374/449  bone]
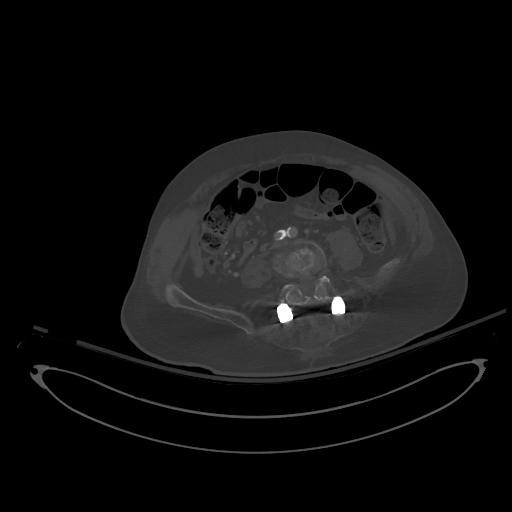

[5 of 16 positions shown; findings below may reference images not displayed]

RADIATION DOSE REDUCTION: This exam was performed according to the
departmental dose-optimization program which includes automated
exposure control, adjustment of the mA and/or kV according to
patient size and/or use of iterative reconstruction technique.

CONTRAST:  100mL OMNIPAQUE IOHEXOL 350 MG/ML SOLN
FINDINGS: VASCULAR

Aorta: There is moderate to heavy calcific plaque without
penetrating ulcer, dissection, significant stenosis or aneurysm.

Celiac: There are ostial calcific plaques but no flow-limiting
stenosis, dissection or aneurysm.

SMA: There are moderate ostial plaques but no flow-limiting
stenosis, dissection or aneurysm. There is a 40% non flow-limiting
calcific origin stenosis.

Renals: There are moderate calcific plaques in both renal ostia.
There is 80% or greater high-grade left renal artery calcific origin
stenosis and up to 60-70% calcific origin stenosis of the right
renal artery. Both are otherwise clear.

IMA: There is a 75% mixed plaque vessel origin stenosis. The
remainder is patent.

RIGHT Lower Extremity

Inflow: There are heavy calcifications in the common femoral artery
with up to 50% stenosis proximally, otherwise without significant
stenosis. There are patchy calcifications and up to 60% stenosis in
the right internal iliac artery. There are minimal calcifications in
the proximal and distal external iliac artery without flow-limiting
stenosis.

Outflow: There is moderate calcification in the wall of the common
femoral artery with up to 40% stenosis. There are patchy
calcifications in the profunda femoral artery without significant
stenosis. Circumflex femoral arteries are patent.

Patchy moderate to heavy calcification is seen along the superficial
femoral artery with irregular up to 70% stenosis distally just
proximal to Hunter's canal and a short segment of occlusion to near
occlusion of the vessel within Hunter's canal. Flow reconstitutes in
the popliteal artery. There are scattered calcifications in the
popliteal artery without flow-limiting stenosis.

Runoff: There is patchy moderate disease in the anterior and
posterior tibial arteries which both demonstrate moderate irregular
stenoses but all 3 trifurcation arteries runoff to the ankle.

LEFT Lower Extremity

Inflow: Heavy wall calcification is seen in the common iliac artery
but no more than 40% vessel stenosis resulting. There are scattered
calcific plaque in the internal iliac artery with preserved flow.
There is minimal calcification in the distal external iliac artery
with normal external iliac artery patency.

Outflow: There are nonstenosing calcifications posteriorly in the
common femoral artery. There are scattered calcifications in the
profunda and circumflex femoral arteries with preserved flow. There
are moderate patchy calcifications of the superficial femoral artery
without flow-limiting stenosis. There is mixed calcific and soft
plaque of the popliteal artery with up to 50% stenosis in the
proximal popliteal artery, otherwise no significant stenosis.

Runoff: There is moderate patchy disease in the anterior tibial and
peroneal arteries with moderate irregular stenosis. Posterior tibial
artery is small in caliber and occludes in the distal foreleg. There
is two-vessel runoff to the ankle via the anterior tibial and
peroneal arteries.

Veins: No obvious venous abnormality within the limitations of this
arterial phase study.

Review of the MIP images confirms the above findings.

NON-VASCULAR

Lower chest: Moderate-to-large sized hiatal hernia. There is
subpleural fibrosis in the lung bases with scattered single layer up
to 3 layer subpleural honeycombing with subpleural bronchiolectasis.
Mild cardiomegaly is noted minimal pericardial effusion anteriorly.
Calcification noted right coronary artery.

Hepatobiliary: Mild hepatic steatosis without mass enhancement.
Gallbladder and bile ducts are unremarkable.

Pancreas: No focal abnormality or ductal dilatation.

Spleen: No focal abnormality or splenomegaly.

Adrenals/Urinary Tract: There is no adrenal mass. There is a 3.5 cm
cyst in the posterior left kidney. Both kidneys are otherwise
unremarkable. There is no hydronephrosis or ureteral stone but
collecting system contrast could obscure intrarenal stones if
present. The bladder is unremarkable as far as seen, with portions
of the right lateral wall obscured by a right hip arthroplasty.

Stomach/Bowel: There are thickened folds in the stomach which were
noted previously. There is no dilated or inflamed small bowel. There
is moderate to severe stool retention, normal caliber appendix.
Left-sided diverticula without evidence of acute diverticulitis.

Lymphatic: No lymphadenopathy is seen in the abdomen or the pelvis.

Reproductive: No prostatomegaly.

Other: There is no incarcerated hernia. There is small umbilical fat
hernia. There is no free air, hemorrhage or fluid.

Musculoskeletal: There is osteopenia with degenerative and
postsurgical changes of the spine, moderate to severe compression
fracture with a chronic appearance at T9.

Again noted is a recent transverse fracture of the superior endplate
of T12 with 10% vertebral height loss, and chronic compression
fracture again at L1 with chronic left L5 pars defect and a chronic
right S1 superior articular process fracture and L2-5 posterior
fusion construct.

There are chronic healed fractures of the proximal right femur and
right inferior pubic ramus and spray artifact from a right hip total
joint replacement and sideplate right femoral fracture fixation
hardware.

There are patchy lucencies in the body and anterior process of the
talus on both sides suspected possibly related to disuse osteopenia,
with more striated lucencies in the calcanei and additional
scattered lucencies in the midfoot bones also more so on the right.
IMPRESSION: VASCULAR

1. Inflow: Moderate to heavy bilateral aortoiliac calcific disease
in general but less so in the external iliac arteries where there is
no flow-limiting narrowing, with no more than 50% calcific stenosis
in the right common femoral artery and no more than 40% stenosis in
the left common iliac artery.
2. Outflow: Moderate patchy calcifications in both superficial
femoral arteries, on the right with irregular up to 70% stenosis
just proximal to Hunter's canal and short segment of occlusion or
near occlusion distally in Hunter's canal with popliteal
reconstitution and no significant popliteal stenosis on the right.
On the left no significant superficial femoral artery stenosis is
seen but there is up to 50% mixed plaque stenosis in the proximal
popliteal artery.
3. Runoff: On the right, patchy moderate disease in the anterior and
posterior tibial arteries is seen but there is three-vessel runoff
to the ankle. On the left, there is moderate patchy disease in the
anterior tibial and peroneal arteries with posterior tibial artery
occlusion in the distal foreleg and two-vessel runoff to the ankle.
4. Flow-limiting calcific stenoses in the left-greater-than-right
renal artery origins.
5. 75% mixed plaque IMA origin stenosis.

NON-VASCULAR

1. Subpleural fibrosis in the lung bases, moderate-to-large hiatal
hernia.
2. Cardiomegaly with coronary artery atherosclerosis.
3. 3.5 cm left renal cyst.
4. Moderate to severe constipation without bowel obstruction
inflammation.
5. Long length right femoral fracture fixation plating and a right
hip arthroplasty with healed fractures.
6. Intraosseous lucencies in the bilateral hindfoot and midfoot
bones, more so on the right, suspected due to disuse osteopenia.
Infectious or metastatic etiology not excluded but would be less
likely. Further evaluation could include bone scintigraphy or MRI if
needed.
7. Again noted transverse fracture across the T12 upper plate first
seen on [DATE] with 10% loss of vertebral body height. No
paraspinal fluid collection is seen.
8. Degenerative and postsurgical changes with osteopenia and
additional chronic compression fractures.

## 2021-07-18 IMAGING — MR MR LUMBAR SPINE W/O CM
5 series · 35 of 48 positions shown · non-contrast
Comparison: Prior CT from [DATE].

CLINICAL DATA: Initial evaluation for acute low back pain.

EXAM:
MRI LUMBAR SPINE WITHOUT CONTRAST
TECHNIQUE: Multiplanar, multisequence MR imaging of the lumbar spine was
performed. No intravenous contrast was administered.

[Series 5: t2_tse_stir_warp_sag · sagittal · 4.0mm · 1.02mm/px · 6 of 22 slices shown]
[im 1/22]
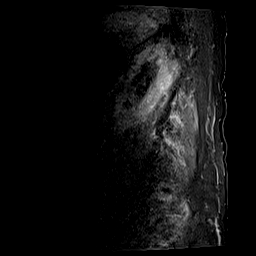
[im 5/22]
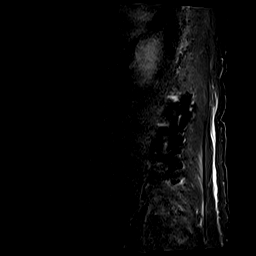
[im 9/22]
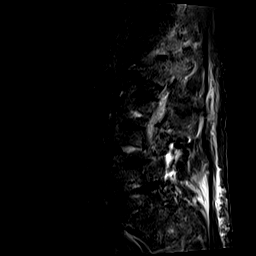
[im 13/22]
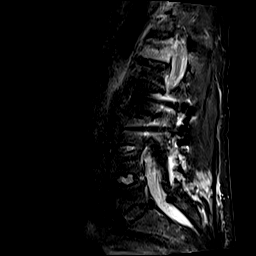
[im 17/22]
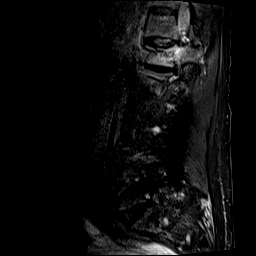
[im 22/22]
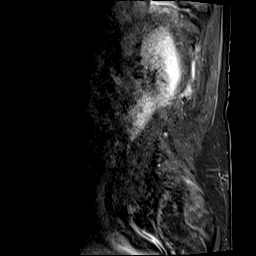

[Series 6: t2_tse_warp_sag · sagittal · 4.0mm · 0.81mm/px · 6 of 22 slices shown]
[im 1/22]
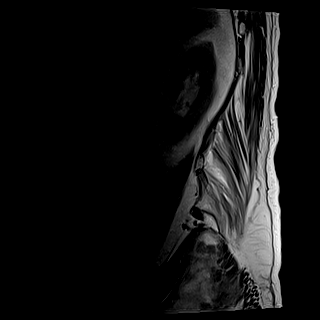
[im 5/22]
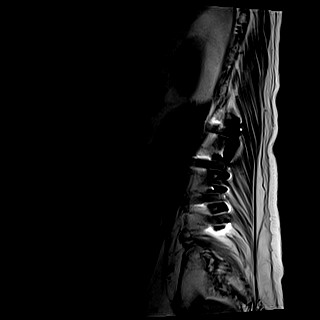
[im 9/22]
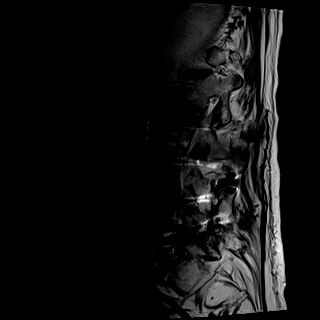
[im 13/22]
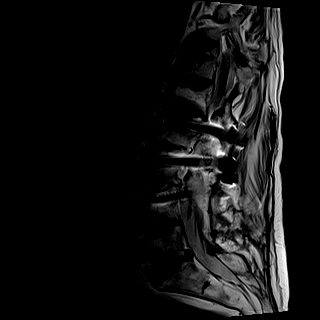
[im 17/22]
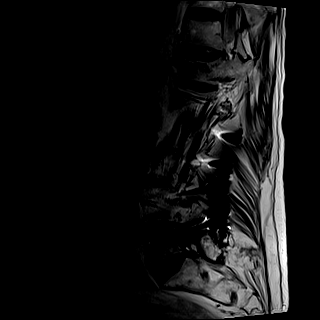
[im 22/22]
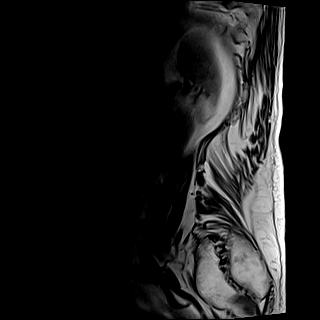

[Series 7: t1_tse_warp_sag · sagittal · 4.0mm · 0.81mm/px · 6 of 22 slices shown]
[im 1/22]
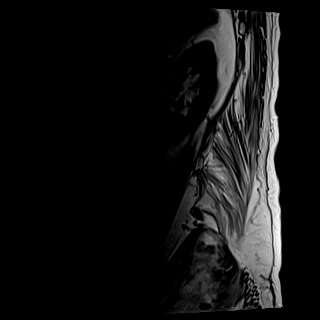
[im 5/22]
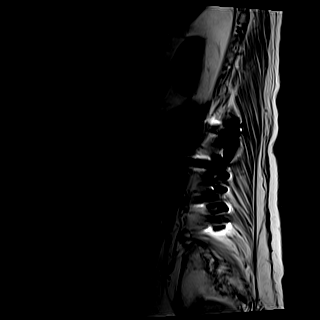
[im 9/22]
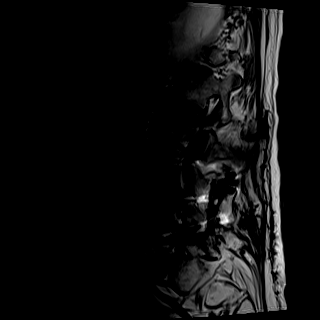
[im 13/22]
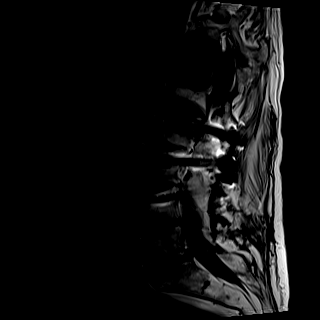
[im 17/22]
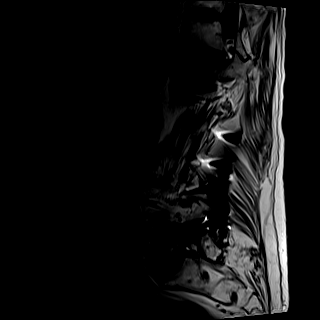
[im 22/22]
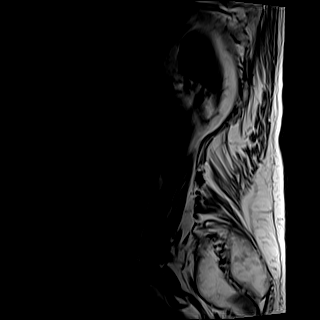

[Series 8: t2_tse_warp_tra · axial · 4.0mm · 0.78mm/px · z∈[-45,+199]mm · 9 of 52 slices shown]
[im 1/52]
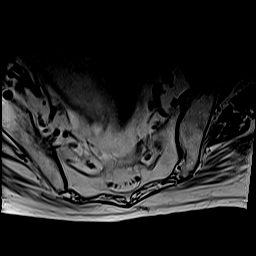
[im 8/52]
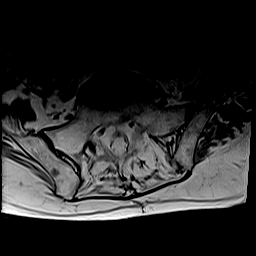
[im 15/52]
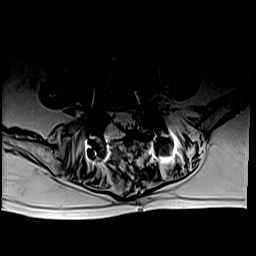
[im 22/52]
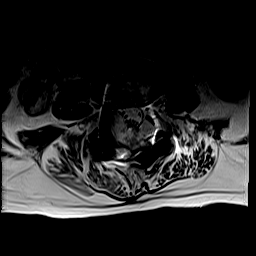
[im 26/52]
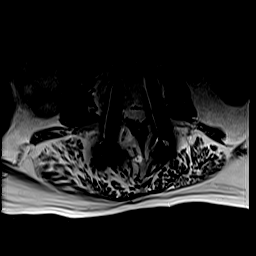
[im 30/52]
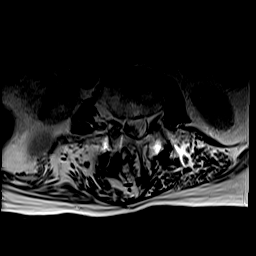
[im 37/52]
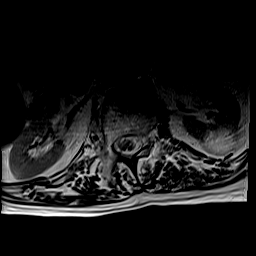
[im 44/52]
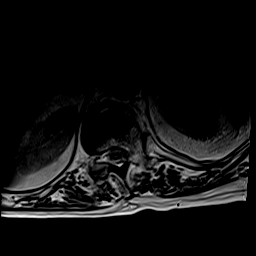
[im 52/52]
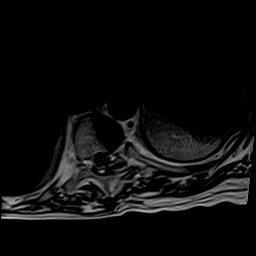

[Series 9: t1_tse_warp_tra · axial · 4.0mm · 0.39mm/px · z∈[-45,+199]mm · 8 of 52 slices shown]
[im 1/52]
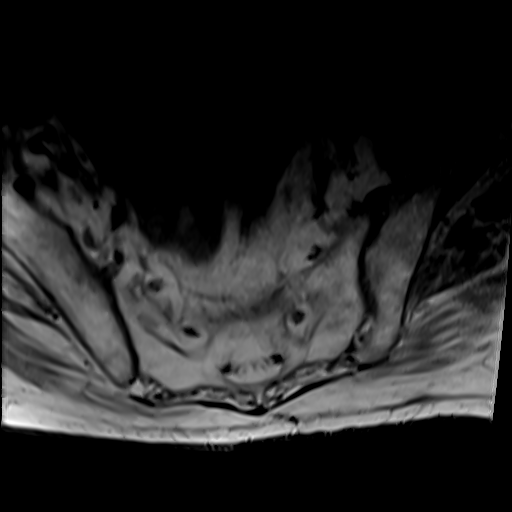
[im 8/52]
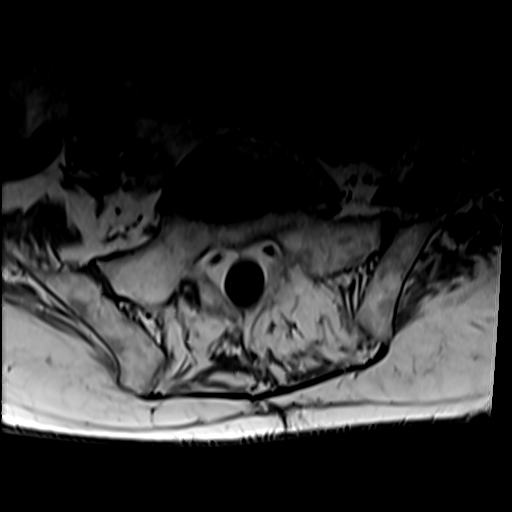
[im 15/52]
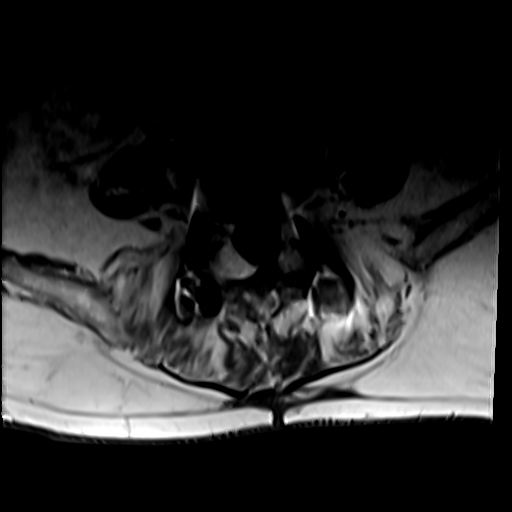
[im 22/52]
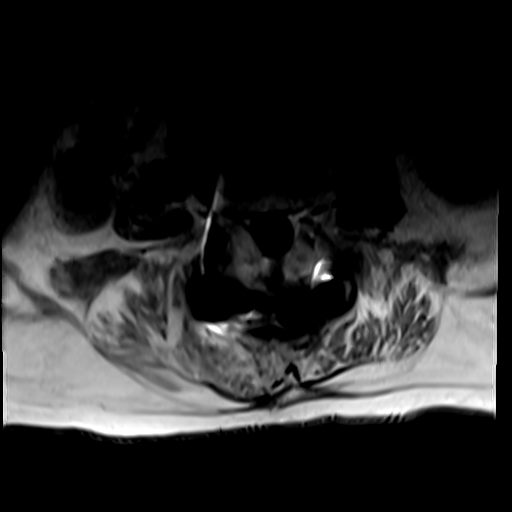
[im 30/52]
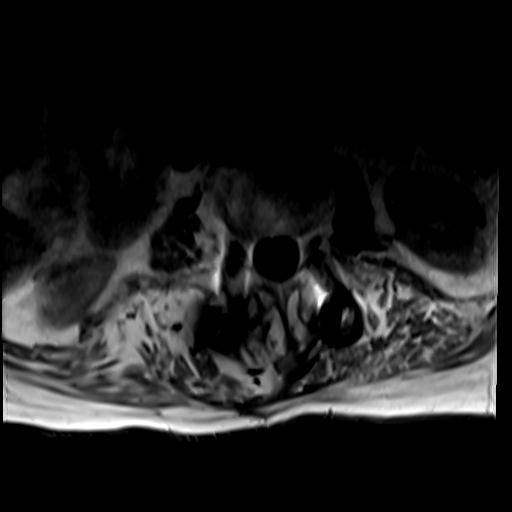
[im 37/52]
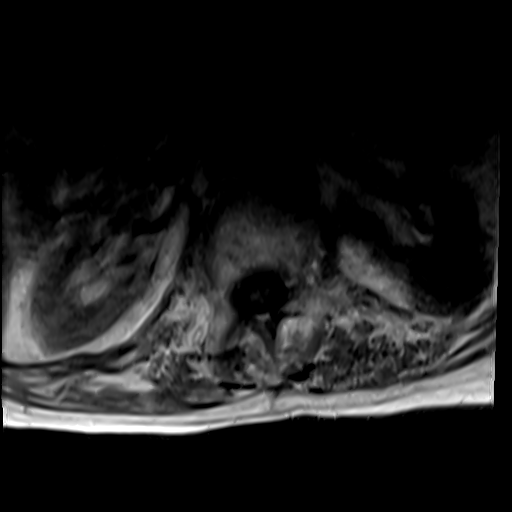
[im 44/52]
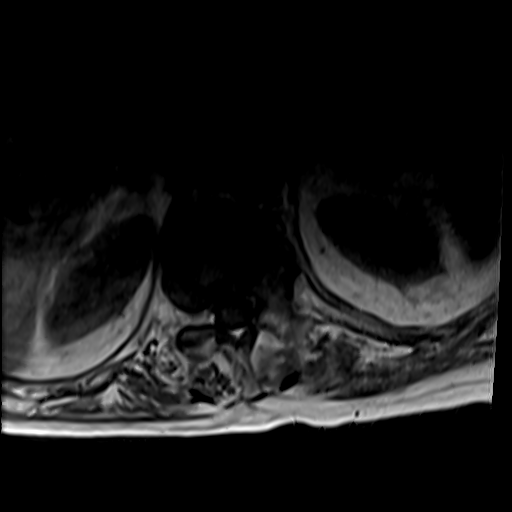
[im 52/52]
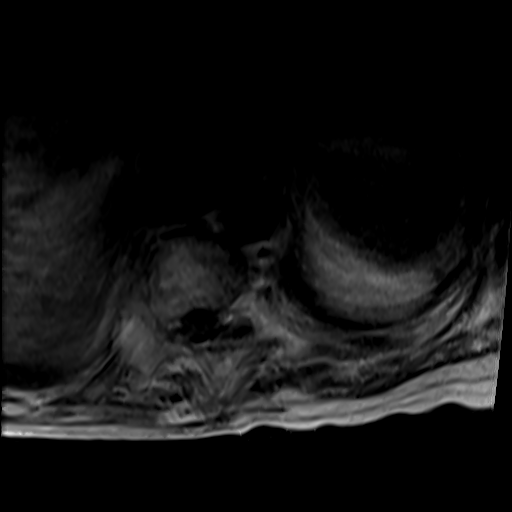

[35 of 48 positions shown; findings below may reference images not displayed]

FINDINGS: Segmentation: Standard. Lowest well-formed disc space labeled the
L5-S1 level.

Alignment: Moderate levoscoliosis. Trace chronic retrolisthesis of
L2 on L3, stable.

Vertebrae: There has been some progressive interval collapse at the
previously identified acute T12 compression fracture, now measuring
up to 25% with trace 2 mm bony retropulsion. No other acute fracture
within the lumbar spine. Chronic L1 compression deformity again
noted. Chronic left-sided pars defect at L5 noted as well. Bone
marrow signal intensity within normal limits. No worrisome osseous
lesions. Susceptibility artifact related to prior posterior fusion
present at L2 through L5. Hardware better evaluated on recent CT.

Conus medullaris and cauda equina: Conus extends to the L1 level.
Conus and cauda equina appear normal.

Paraspinal and other soft tissues: Postoperative changes from prior
posterior fusion seen throughout the posterior paraspinous soft
tissues. A degree of superimposed muscular injury/strain would be
difficult to exclude, and could be present as well. 3.2 cm simple
left renal cyst noted, benign in appearance, no follow-up imaging
recommended.

Disc levels:

T11-12: Trace 2 mm bony retropulsion related to the acute T12
compression fracture. No significant disc bulge. Mild facet
hypertrophy. No significant spinal stenosis. Foramina remain patent.

T12-L1: Trace bony retropulsion related to the chronic L1
compression fracture. No significant disc bulge. No spinal stenosis.
Foramina remain patent.

L1-2: Disc desiccation. Superimposed broad-based right foraminal to
extraforaminal disc protrusion (series 8, image 19). Mild facet
hypertrophy. No significant spinal stenosis. Foramina remain patent.

L2-3: Degenerative intervertebral disc space narrowing with diffuse
disc bulge and reactive endplate spurring. Prior posterior fusion.
Mild facet hypertrophy. Mild narrowing of the right lateral recess.
Central canal remains patent. No significant foraminal stenosis.

L3-4: Prior posterior decompression with fusion. Right-sided
reactive endplate spurring without significant disc bulge. No spinal
stenosis. Foramina remain patent.

L4-5: Prior posterior decompression with fusion. Bilateral facet
hypertrophy. Reactive endplate spurring without significant disc
bulge. No spinal stenosis. Foramina remain patent.

L5-S1: Diffuse disc bulge. Prior posterior decompression. Moderate
right worse than left facet hypertrophy. No significant spinal
stenosis. Moderate left worse than right L5 foraminal narrowing.
IMPRESSION: 1. Acute T12 compression fracture with 25% height loss and trace 2
mm bony retropulsion. This is mildly progressed as compared to prior
CT from [DATE]. No significant stenosis.
2. Prior posterior decompression with fusion at L2-3 through L5-S1
without significant residual spinal stenosis.
3. Moderate left worse than right L5 foraminal stenosis related to
disc bulge, reactive endplate spurring, and facet hypertrophy.
4. Chronic L1 compression fracture, stable.

## 2021-07-18 MED ORDER — LACTATED RINGERS IV BOLUS
1000.0000 mL | Freq: Once | INTRAVENOUS | Status: AC
  Administered 2021-07-18: 1000 mL via INTRAVENOUS

## 2021-07-18 MED ORDER — HYDROMORPHONE HCL 1 MG/ML IJ SOLN
0.5000 mg | Freq: Once | INTRAMUSCULAR | Status: AC
  Administered 2021-07-18: 0.5 mg via INTRAVENOUS
  Filled 2021-07-18: qty 1

## 2021-07-18 NOTE — ED Provider Triage Note (Signed)
Emergency Medicine Provider Triage Evaluation Note  Aaron [name] , a 81 y.o. male  was evaluated in triage.  Pt complains of weakness and left flank pain.  Has fallen 3 times in the last week by tripping and landing on his knees.  Denies hitting head or LOC.  No recent fevers or illness.  Unsure of urinary symptoms.  Denies constipation.  Review of Systems  Positive:  Negative: Above  Physical Exam  BP (!) 166/93   Pulse 79   Temp 98.5 F (36.9 C) (Oral)   Resp 15   Ht 5\' 6"  (1.676 m)   Wt 59 kg   SpO2 99%   BMI 20.98 kg/m  Gen:   Awake, no distress   Resp:  Normal effort, CTAB MSK:   Moves extremities with some difficulty Other:  Left flank tenderness, abdomen soft nontender.  Afebrile.  Medical Decision Making  Medically screening exam initiated at 3:56 PM.  Appropriate orders placed.  Aaron [name] was informed that the remainder of the evaluation will be completed by another provider, this initial triage assessment does not replace that evaluation, and the importance of remaining in the ED until their evaluation is complete.     Aaron Coffee, PA-C 07/18/21 1601

## 2021-07-18 NOTE — ED Provider Notes (Signed)
[facility] EMERGENCY DEPARTMENT Provider Note   CSN: [id number] Arrival date & time: 07/18/21  1348     History  Chief Complaint  Patient presents with   Weakness    Aaron [name] is a 81 y.o. male.  HPI 81 year old male presents with back pain and leg weakness.  He has been feeling poorly for about a week.  He went to [facility] and had some sort of imaging that he thinks might of been a CT but he is not sure.  No fevers or incontinence.  No abdominal pain.  Both legs feel weak and seem to give out on him and he has had 3 falls.  The back pain is in his mid lumbar back.  Does not radiate.  He does not feel like there is any numbness in his legs.  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  He was prescribed muscle relaxer, steroids and NSAIDs at the outside hospital.  Home Medications Prior to Admission medications   Medication Sig Start Date End Date Taking? Authorizing Provider  atorvastatin (LIPITOR) 80 MG tablet Take 80 mg by mouth daily. 02/15/17   [provider]  CALCIUM-MAGNESIUM-ZINC PO Take 1 tablet by mouth daily.    [provider]  docusate sodium (COLACE) 100 MG capsule Take 1 capsule (100 mg total) by mouth 2 (two) times daily. 10/28/20   Hongalgi, Maximino Greenland, MD  feeding supplement (ENSURE ENLIVE / ENSURE PLUS) LIQD Take 237 mLs by mouth 2 (two) times daily between meals. 10/28/20   Hongalgi, Maximino Greenland, MD  folic acid (FOLVITE) 1 MG tablet Take 1 tablet (1 mg total) by mouth daily. 10/29/20   Hongalgi, Maximino Greenland, MD  HYDROcodone-acetaminophen (NORCO/VICODIN) 5-325 MG tablet Take 1 tablet by mouth every 4 (four) hours as needed for moderate pain. 10/22/20   Swinteck, Arlys John, MD  latanoprost (XALATAN) 0.005 % ophthalmic solution Place 1 drop into both eyes at bedtime. 02/15/17   [provider]  midodrine (PROAMATINE) 10 MG tablet Take 1 tablet (10 mg total) by mouth 3 (three) times daily  with meals. 10/28/20   Hongalgi, Maximino Greenland, MD  Multiple Vitamins-Minerals (CEROVITE SENIOR) TABS Take 1 tablet by mouth daily.    [provider]  pantoprazole (PROTONIX) 40 MG tablet Take 40 mg by mouth daily. 02/15/17   [provider]  phenytoin (DILANTIN) 100 MG ER capsule Take 300 mg by mouth at bedtime.  02/15/17   [provider]  polyethylene glycol (MIRALAX / GLYCOLAX) 17 g packet Take 17 g by mouth daily as needed for moderate constipation or mild constipation. 10/28/20   Hongalgi, Maximino Greenland, MD  senna (SENOKOT) 8.6 MG TABS tablet Take 1 tablet (8.6 mg total) by mouth 2 (two) times daily. 10/28/20   Hongalgi, Maximino Greenland, MD      Allergies    Patient has no known allergies.    Review of Systems   Review of Systems  Constitutional:  Negative for fever.  Gastrointestinal:  Negative for abdominal pain.  Genitourinary:        No incontinence  Musculoskeletal:  Positive for back pain.  Neurological:  Positive for weakness. Negative for numbness.    Physical Exam Updated Vital Signs BP (!) 165/106 (BP Location: Right Arm)   Pulse 80   Temp 98.5 F (36.9 C) (Oral)   Resp 13   Ht 5\' 6"  (1.676 m)   Wt 59 kg  SpO2 100%   BMI 20.98 kg/m  Physical Exam Vitals and nursing note reviewed.  Constitutional:      Appearance: He is well-developed.  HENT:     Head: Normocephalic and atraumatic.  Cardiovascular:     Rate and Rhythm: Normal rate and regular rhythm.     Heart sounds: Normal heart sounds.  Pulmonary:     Effort: Pulmonary effort is normal.     Breath sounds: Normal breath sounds.  Abdominal:     General: There is no distension.     Palpations: Abdomen is soft.     Tenderness: There is no abdominal tenderness.  Musculoskeletal:     Thoracic back: No tenderness or bony tenderness.     Lumbar back: Bony tenderness present.     Comments: Both lower extremities are cool to the touch.  I cannot palpate any pulses though I am getting a strong Doppler  signal in the right DP.  The left DP seems weaker though does have a present signal.  Skin:    General: Skin is warm and dry.  Neurological:     Mental Status: He is alert.     Comments: Patient seems to have good strength in his left lower extremity though some weakness in the right lower extremity, also induces pain.  Grossly normal sensation bilaterally.  Right foot drop, which is chronic per patient.    ED Results / Procedures / Treatments   Labs (all labs ordered are listed, but only abnormal results are displayed) Labs Reviewed  COMPREHENSIVE METABOLIC PANEL - Abnormal; Notable for the following components:      Result Value   Glucose, Bld 133 (*)    Calcium 8.7 (*)    Alkaline Phosphatase 210 (*)    All other components within normal limits  CBC WITH DIFFERENTIAL/PLATELET - Abnormal; Notable for the following components:   Hemoglobin 12.8 (*)    All other components within normal limits  LIPASE, BLOOD  URINALYSIS, ROUTINE W REFLEX MICROSCOPIC    EKG EKG Interpretation  Date/Time:  Monday July 18 2021 19:03:17 EDT Ventricular Rate:  76 PR Interval:  132 QRS Duration: 114 QT Interval:  410 QTC Calculation: 461 R Axis:   83 Text Interpretation: Sinus rhythm Incomplete right bundle branch block similar to 2019 Confirmed by Pricilla LovelessGoldston, Traycen Goyer [phone]([id number]) on 07/18/2021 8:11:53 PM  Radiology No results found.  Procedures Procedures    Medications Ordered in ED Medications  lactated ringers bolus 1,000 mL (has no administration in time range)  HYDROmorphone (DILAUDID) injection 0.5 mg (has no administration in time range)    ED Course/ Medical Decision Making/ A&P                           Medical Decision Making Amount and/or Complexity of Data Reviewed Labs: ordered. Radiology: ordered.  Risk Prescription drug management.   Patient has back pain but he is also complained of leg weakness.  However he does not have significant sciatica symptoms with no radicular  pain or numbness.  There is some questionable arterial insufficiency on bedside Doppler and so I think getting a CTA is warranted.  However this has been quite delayed and while he does not appear to have an acute occlusion I will also order the MRI.  Dispo per results. Care to Dr. Bebe ShaggyWickline.         Final Clinical Impression(s) / ED Diagnoses Final diagnoses:  None    Rx / DC Orders  ED Discharge Orders     None         Pricilla Loveless, MD 07/18/21 2332

## 2021-07-18 NOTE — ED Notes (Signed)
Patient transported to MRI 

## 2021-07-18 NOTE — ED Provider Notes (Signed)
Plan to f/u on CT angio LE and also MRI lumbar If negative and can walk, he can be discharged   Zadie Rhine, MD 07/18/21 2333

## 2021-07-18 NOTE — ED Triage Notes (Signed)
Pt brought in by pt's son. Pt reports falling three times in the last week due to weakness that has progressed over the  past few weeks. Pt's son dropped son off but left a list of concerns on a notepad that notes loss of leg strength, loss of appetite, dark urine, 3 falls in the last week. Pt states he went to [facility] hospital last week for a fall.

## 2021-07-19 ENCOUNTER — Emergency Department (HOSPITAL_COMMUNITY): Payer: Medicare Other

## 2021-07-19 ENCOUNTER — Inpatient Hospital Stay (HOSPITAL_COMMUNITY): Payer: Medicare Other

## 2021-07-19 DIAGNOSIS — S22080A Wedge compression fracture of T11-T12 vertebra, initial encounter for closed fracture: Secondary | ICD-10-CM | POA: Diagnosis present

## 2021-07-19 DIAGNOSIS — M21371 Foot drop, right foot: Secondary | ICD-10-CM | POA: Diagnosis present

## 2021-07-19 DIAGNOSIS — R739 Hyperglycemia, unspecified: Secondary | ICD-10-CM | POA: Diagnosis present

## 2021-07-19 DIAGNOSIS — Z79899 Other long term (current) drug therapy: Secondary | ICD-10-CM | POA: Diagnosis not present

## 2021-07-19 DIAGNOSIS — M8000XA Age-related osteoporosis with current pathological fracture, unspecified site, initial encounter for fracture: Secondary | ICD-10-CM | POA: Diagnosis not present

## 2021-07-19 DIAGNOSIS — Z66 Do not resuscitate: Secondary | ICD-10-CM | POA: Diagnosis present

## 2021-07-19 DIAGNOSIS — I119 Hypertensive heart disease without heart failure: Secondary | ICD-10-CM | POA: Diagnosis present

## 2021-07-19 DIAGNOSIS — K449 Diaphragmatic hernia without obstruction or gangrene: Secondary | ICD-10-CM | POA: Diagnosis present

## 2021-07-19 DIAGNOSIS — J8410 Pulmonary fibrosis, unspecified: Secondary | ICD-10-CM | POA: Diagnosis present

## 2021-07-19 DIAGNOSIS — Z7982 Long term (current) use of aspirin: Secondary | ICD-10-CM | POA: Diagnosis not present

## 2021-07-19 DIAGNOSIS — I4510 Unspecified right bundle-branch block: Secondary | ICD-10-CM | POA: Diagnosis present

## 2021-07-19 DIAGNOSIS — E538 Deficiency of other specified B group vitamins: Secondary | ICD-10-CM | POA: Diagnosis present

## 2021-07-19 DIAGNOSIS — Z96641 Presence of right artificial hip joint: Secondary | ICD-10-CM | POA: Diagnosis present

## 2021-07-19 DIAGNOSIS — I701 Atherosclerosis of renal artery: Secondary | ICD-10-CM | POA: Diagnosis present

## 2021-07-19 DIAGNOSIS — R296 Repeated falls: Secondary | ICD-10-CM | POA: Diagnosis present

## 2021-07-19 DIAGNOSIS — I708 Atherosclerosis of other arteries: Secondary | ICD-10-CM | POA: Diagnosis present

## 2021-07-19 DIAGNOSIS — M8008XA Age-related osteoporosis with current pathological fracture, vertebra(e), initial encounter for fracture: Secondary | ICD-10-CM | POA: Diagnosis present

## 2021-07-19 DIAGNOSIS — K5900 Constipation, unspecified: Secondary | ICD-10-CM | POA: Diagnosis present

## 2021-07-19 DIAGNOSIS — G40909 Epilepsy, unspecified, not intractable, without status epilepticus: Secondary | ICD-10-CM | POA: Diagnosis present

## 2021-07-19 DIAGNOSIS — I2510 Atherosclerotic heart disease of native coronary artery without angina pectoris: Secondary | ICD-10-CM | POA: Diagnosis present

## 2021-07-19 DIAGNOSIS — I69398 Other sequelae of cerebral infarction: Secondary | ICD-10-CM | POA: Diagnosis not present

## 2021-07-19 DIAGNOSIS — Z9181 History of falling: Secondary | ICD-10-CM | POA: Diagnosis not present

## 2021-07-19 DIAGNOSIS — M8588 Other specified disorders of bone density and structure, other site: Secondary | ICD-10-CM | POA: Diagnosis present

## 2021-07-19 DIAGNOSIS — Z981 Arthrodesis status: Secondary | ICD-10-CM | POA: Diagnosis not present

## 2021-07-19 DIAGNOSIS — I700 Atherosclerosis of aorta: Secondary | ICD-10-CM | POA: Diagnosis present

## 2021-07-19 DIAGNOSIS — E785 Hyperlipidemia, unspecified: Secondary | ICD-10-CM | POA: Diagnosis present

## 2021-07-19 LAB — TSH: TSH: 2.522 u[IU]/mL (ref 0.350–4.500)

## 2021-07-19 LAB — CBC
HCT: 36.4 % — ABNORMAL LOW (ref 39.0–52.0)
Hemoglobin: 12.2 g/dL — ABNORMAL LOW (ref 13.0–17.0)
MCH: 30.1 pg (ref 26.0–34.0)
MCHC: 33.5 g/dL (ref 30.0–36.0)
MCV: 89.9 fL (ref 80.0–100.0)
Platelets: 268 10*3/uL (ref 150–400)
RBC: 4.05 MIL/uL — ABNORMAL LOW (ref 4.22–5.81)
RDW: 14.9 % (ref 11.5–15.5)
WBC: 7.7 10*3/uL (ref 4.0–10.5)
nRBC: 0 % (ref 0.0–0.2)

## 2021-07-19 LAB — CREATININE, SERUM
Creatinine, Ser: 0.71 mg/dL (ref 0.61–1.24)
GFR, Estimated: >60 mL/min (ref >60)
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
## 2021-07-19 IMAGING — CT CT T SPINE W/O CM
2 of 4 series · 8 of 34 positions shown, 10 images · non-contrast
Comparison: Lumbar MRI [OH] hours today. TAKE[facility] Lumbar
spine CT [DATE].

CLINICAL DATA: 80-year-old male status post multiple falls last
week. Weakness. Decreased appetite, loss of leg strength.

EXAM:
CT THORACIC SPINE WITHOUT CONTRAST
TECHNIQUE: Multidetector CT images of the thoracic were obtained using the
standard protocol without intravenous contrast.
RADIATION DOSE REDUCTION: This exam was performed according to the
departmental dose-optimization program which includes automated
exposure control, adjustment of the mA and/or kV according to
patient size and/or use of iterative reconstruction technique.

[Series 11: cor bone · coronal · 0.24mm/px · 3 of 89 slices shown]
[im 18/89  bone]
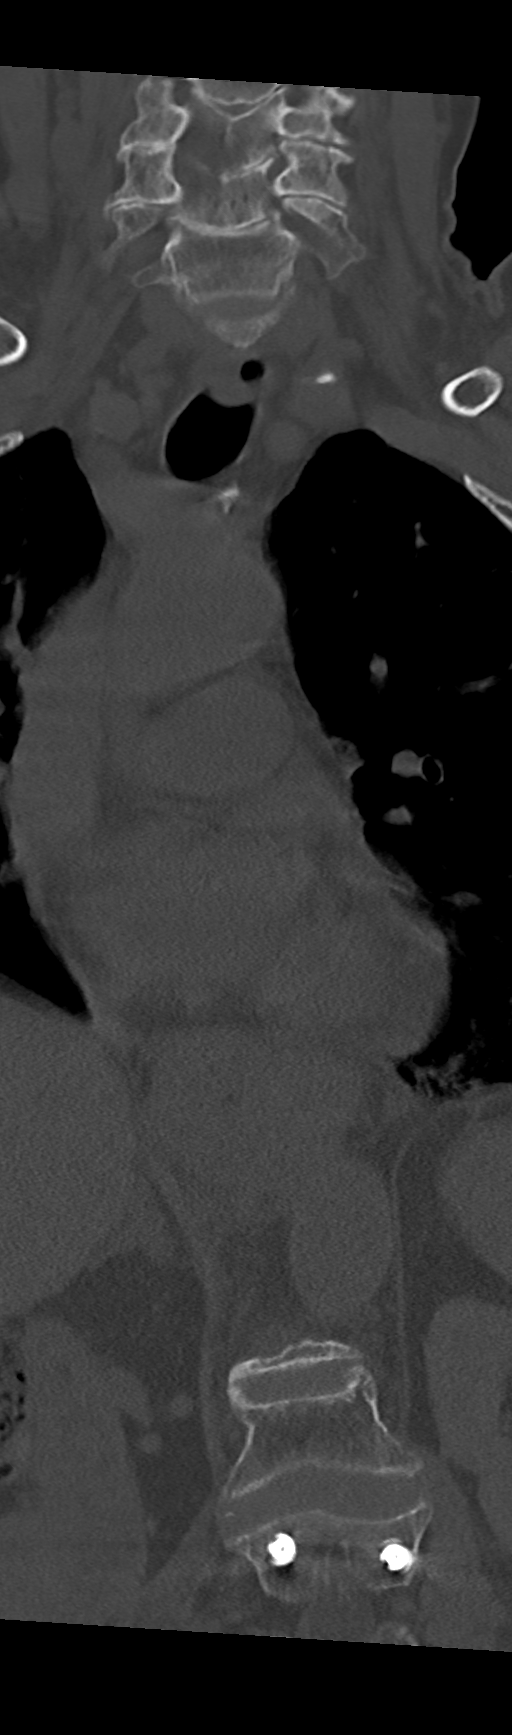
[im 36/89  bone]
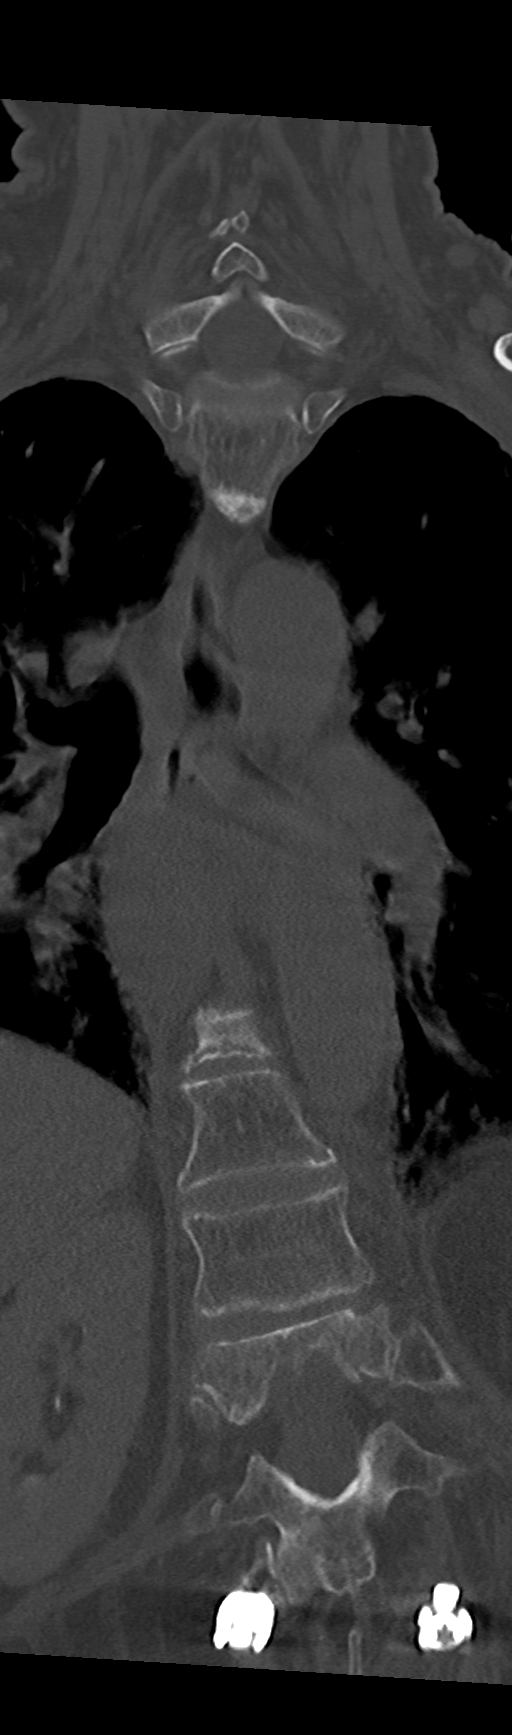
[im 53/89  bone]
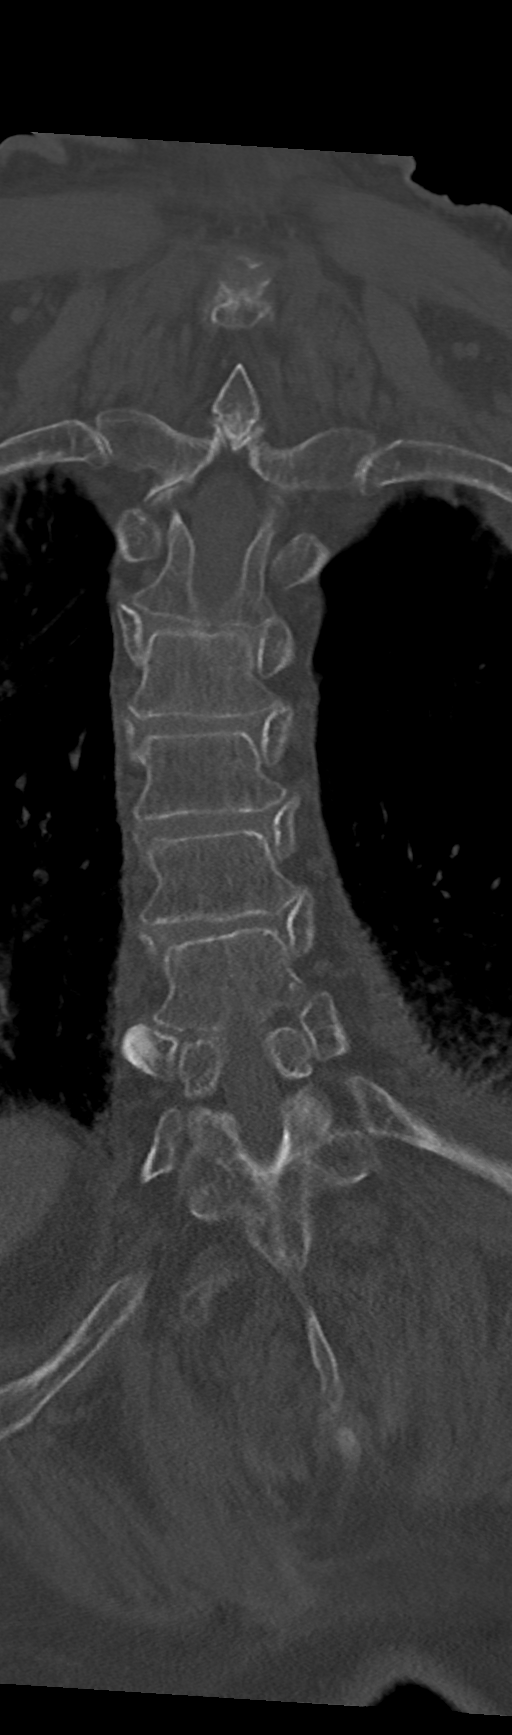

[Series 12: orthogonal bone · axial · 0.21mm/px · z∈[+1081,+1250]mm · 5 of 167 slices shown, 7 images]
[im 28/167  soft-tissue]
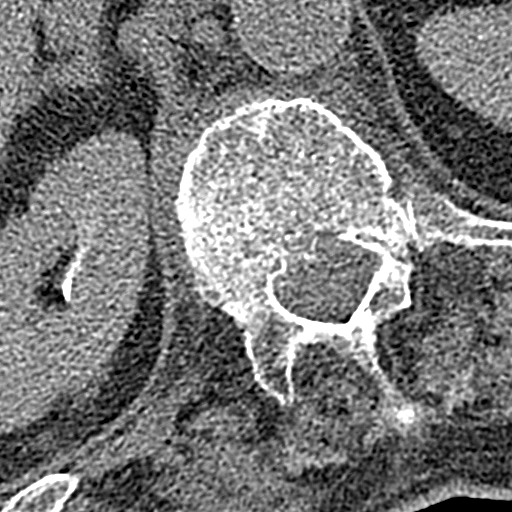
[im 28/167  bone]
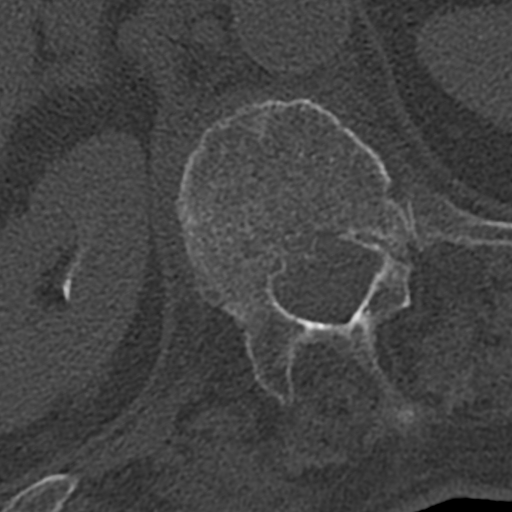
[im 56/167  bone]
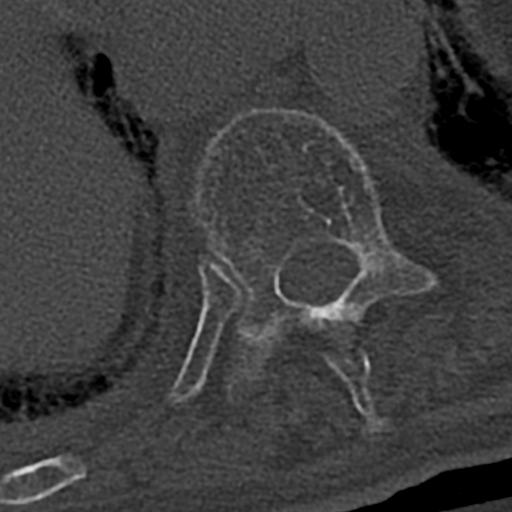
[im 84/167  bone]
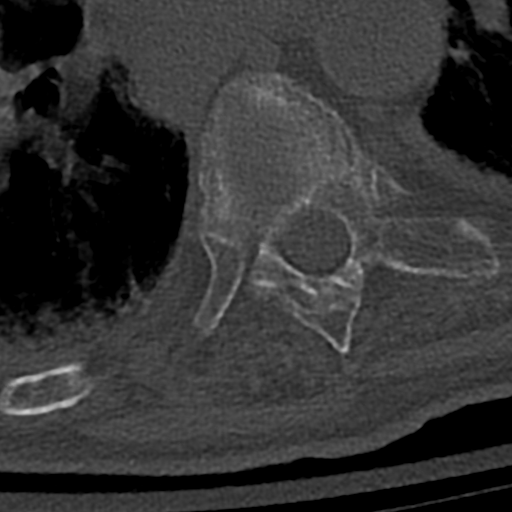
[im 111/167  bone]
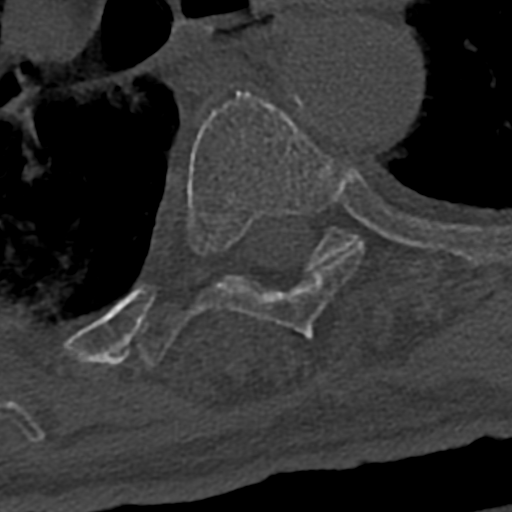
[im 139/167  soft-tissue]
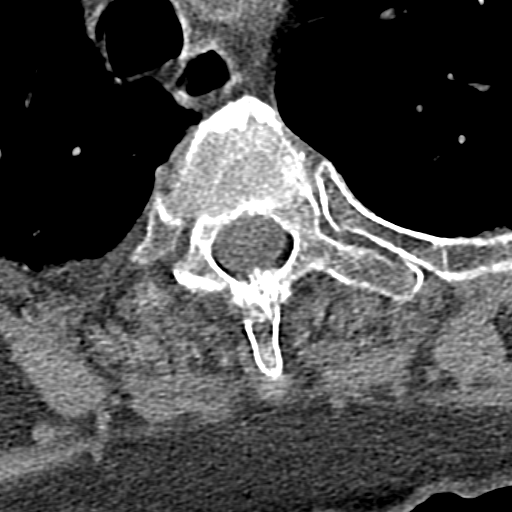
[im 139/167  bone]
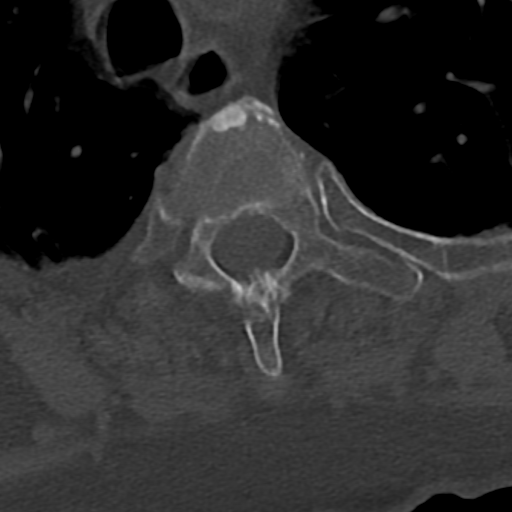

[8 of 34 positions shown; findings below may reference images not displayed]

FINDINGS: Limited cervical spine imaging: Disc and endplate degeneration
posteriorly, most pronounced at C6-C7 with some vacuum phenomena
there. Cervicothoracic junction alignment is within normal limits.

Thoracic spine segmentation: Normal, concordant with lumbar spine
numbering today this month.

Alignment: Mild to moderate for age dextroconvex kyphoscoliosis. No
significant thoracic spondylolisthesis.

Vertebrae: Osteopenia.  Benign T2 vertebral body hemangioma.

Subtle T2 superior endplate compression, age indeterminate.

T3 through T8 levels appear intact.

Severe but age indeterminate T9 compression fracture with central
vertebral plana (series 10, image 32). Uncertain whether this was
present on [OH] chest and rib radiographs.

T10 and T11 appear intact. Benign left T10 vertebral body
hemangioma.

T12 compression fracture with superior endplate comminution and
fractured left pedicle (series 10, image 46. Mild loss of height is
stable since [DATE], approximately 15%. No retropulsion. Right
pedicle and other posterior elements appear to remain intact.

Chronic L1 compression fracture is stable. Partially visible L2
spinal fusion hardware.

Rib osteopenia and some motion artifact. No acute posterior rib
fracture identified.

Paraspinal and other soft tissues: Dependent and subpleural
reticular opacity in both lungs raises the possibility of pulmonary
fibrosis. Major airways are patent. Small to moderate size gastric
hiatal hernia. Calcified coronary artery atherosclerosis and/or
stents. Calcified aortic atherosclerosis. No pericardial or pleural
effusion. Excreted IV contrast in nondilated renal collecting
systems. Benign appearing left renal midpole cyst (no follow-up
imaging recommended). Negative visible other abdominal viscera.

Mild paraspinal hematoma and/or edema at the T12 level. Other
thoracic paraspinal soft tissues are within normal limits.

Disc levels:

Fairly capacious thoracic spinal canal. Minimal retropulsion of the
T12 posterosuperior endplate. No significant thoracic spinal
stenosis by CT.
IMPRESSION: 1. Osteopenia. Acute T12 compression fracture as seen recently by
Lumbar CT and MRI is stable since [DATE]. [DATE]% loss of height.
Mild paraspinal hematoma/edema. Associated left T12 pedicle fracture
but no significant retropulsion or other complicating features.

2. Age indeterminate mild T2 and severe T9 compression fractures
with no complicating features. Chronic L1 compression fracture.

3. Suspected pulmonary fibrosis. Calcified coronary artery
atherosclerosis. Aortic Atherosclerosis ([OH]-[OH]). Small to
moderate size gastric hiatal hernia.
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
## 2021-07-19 MED ORDER — SENNOSIDES-DOCUSATE SODIUM 8.6-50 MG PO TABS
2.0000 | ORAL_TABLET | Freq: Every day | ORAL | Status: DC
  Administered 2021-07-19: 2 via ORAL
  Filled 2021-07-19 (×2): qty 2

## 2021-07-19 MED ORDER — POLYETHYLENE GLYCOL 3350 17 G PO PACK: 17.0000 g | PACK | Freq: Every day | ORAL | Status: DC

## 2021-07-19 MED ORDER — ENOXAPARIN SODIUM 40 MG/0.4ML IJ SOSY
40.0000 mg | PREFILLED_SYRINGE | INTRAMUSCULAR | Status: DC
  Administered 2021-07-19 – 2021-07-22 (×4): 40 mg via SUBCUTANEOUS
  Filled 2021-07-19 (×4): qty 0.4

## 2021-07-19 MED ORDER — IOHEXOL 350 MG/ML SOLN
100.0000 mL | Freq: Once | INTRAVENOUS | Status: AC | PRN
  Administered 2021-07-19: 100 mL via INTRAVENOUS

## 2021-07-19 MED ORDER — ATORVASTATIN CALCIUM 80 MG PO TABS
80.0000 mg | ORAL_TABLET | Freq: Every day | ORAL | Status: DC
  Administered 2021-07-19 – 2021-07-22 (×4): 80 mg via ORAL
  Filled 2021-07-19 (×4): qty 1

## 2021-07-19 MED ORDER — ASPIRIN 81 MG PO TBEC
81.0000 mg | DELAYED_RELEASE_TABLET | Freq: Every day | ORAL | Status: DC
  Administered 2021-07-19 – 2021-07-22 (×4): 81 mg via ORAL
  Filled 2021-07-19 (×4): qty 1

## 2021-07-19 MED ORDER — LISINOPRIL 10 MG PO TABS
10.0000 mg | ORAL_TABLET | Freq: Every day | ORAL | Status: DC
  Administered 2021-07-19 – 2021-07-22 (×4): 10 mg via ORAL
  Filled 2021-07-19 (×4): qty 1

## 2021-07-19 MED ORDER — ACETAMINOPHEN 325 MG PO TABS
650.0000 mg | ORAL_TABLET | Freq: Four times a day (QID) | ORAL | Status: DC | PRN
  Administered 2021-07-19 – 2021-07-22 (×5): 650 mg via ORAL
  Filled 2021-07-19 (×5): qty 2

## 2021-07-19 MED ORDER — LATANOPROST 0.005 % OP SOLN
1.0000 [drp] | Freq: Every day | OPHTHALMIC | Status: DC
  Administered 2021-07-19 – 2021-07-21 (×3): 1 [drp] via OPHTHALMIC
  Filled 2021-07-19: qty 2.5

## 2021-07-19 MED ORDER — KETOROLAC TROMETHAMINE 15 MG/ML IJ SOLN: 15.0000 mg | Freq: Three times a day (TID) | INTRAMUSCULAR | Status: DC | PRN

## 2021-07-19 MED ORDER — PANTOPRAZOLE SODIUM 40 MG PO TBEC
40.0000 mg | DELAYED_RELEASE_TABLET | Freq: Every day | ORAL | Status: DC
  Administered 2021-07-19 – 2021-07-22 (×4): 40 mg via ORAL
  Filled 2021-07-19 (×4): qty 1

## 2021-07-19 MED ORDER — POLYETHYLENE GLYCOL 3350 17 G PO PACK
17.0000 g | PACK | Freq: Two times a day (BID) | ORAL | Status: DC
  Administered 2021-07-19 – 2021-07-22 (×4): 17 g via ORAL
  Filled 2021-07-19 (×6): qty 1
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
## 2021-07-19 NOTE — Progress Notes (Signed)
Orthopedic Tech Progress Note Patient Details:  Aaron [name] [birth year] [id number]  Ortho Devices Type of Ortho Device: Thoracolumbar corset (TLSO) Ortho Device/Splint Location: BACK Ortho Device/Splint Interventions: Ordered   Post Interventions Patient Tolerated: Well Instructions Provided: Care of device  Donald Pore 07/19/2021, 3:59 PM

## 2021-07-19 NOTE — ED Notes (Signed)
MD at bedside. 

## 2021-07-19 NOTE — ED Provider Notes (Signed)
Discussed with internal medicine resident for admission.  Patient resting comfortably. Before given Dilantin, will await levels Patient stable in the emergency department    Zadie Rhine, MD 07/19/21 0725

## 2021-07-19 NOTE — ED Notes (Signed)
Purewick and Pericare administered
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
## 2021-07-19 NOTE — ED Notes (Signed)
Patient transported to CT 

## 2021-07-19 NOTE — ED Notes (Signed)
Patient transported to MRI 

## 2021-07-19 NOTE — ED Provider Notes (Signed)
Discussed MRI findings with on-call neurosurgery APP Leo Grosser.  She recommends CT thoracic spine, TLSO and may be admitted to medicine for PT and pain control  On my exam patient is in no acute distress, reporting low back pain.  He has some weakness in his right leg which he reports started over the past week, but has been documented previously. Patient denies any headache or neck pain. He does have a history of seizures, will check Dilantin level  Vascular study was performed that revealed significant disease but no acute findings   Zadie Rhine, MD 07/19/21 [phone]

## 2021-07-20 ENCOUNTER — Inpatient Hospital Stay (HOSPITAL_COMMUNITY): Payer: Medicare Other

## 2021-07-20 DIAGNOSIS — S22080A Wedge compression fracture of T11-T12 vertebra, initial encounter for closed fracture: Secondary | ICD-10-CM | POA: Diagnosis not present

## 2021-07-20 LAB — LIPID PANEL
Cholesterol: 134 mg/dL (ref 0–200)
HDL: 40 mg/dL — ABNORMAL LOW (ref >40)
LDL Cholesterol: 79 mg/dL (ref 0–99)
Total CHOL/HDL Ratio: 3.4 RATIO
Triglycerides: 75 mg/dL (ref <150)
VLDL: 15 mg/dL (ref 0–40)

## 2021-07-20 LAB — BASIC METABOLIC PANEL
Anion gap: 11 (ref 5–15)
BUN: 13 mg/dL (ref 8–23)
CO2: 25 mmol/L (ref 22–32)
Calcium: 8.5 mg/dL — ABNORMAL LOW (ref 8.9–10.3)
Chloride: 97 mmol/L — ABNORMAL LOW (ref 98–111)
Creatinine, Ser: 0.77 mg/dL (ref 0.61–1.24)
GFR, Estimated: >60 mL/min (ref >60)
Glucose, Bld: 95 mg/dL (ref 70–99)
Potassium: 4.5 mmol/L (ref 3.5–5.1)
Sodium: 133 mmol/L — ABNORMAL LOW (ref 135–145)

## 2021-07-20 LAB — PHENYTOIN LEVEL, FREE AND TOTAL
Phenytoin, Free: 1.7 ug/mL (ref 1.0–2.0)
Phenytoin, Total: 17 ug/mL (ref 10.0–20.0)

## 2021-07-20 LAB — HEMOGLOBIN A1C
Hgb A1c MFr Bld: 5.2 % (ref 4.8–5.6)
Mean Plasma Glucose: 102.54 mg/dL

## 2021-07-20 LAB — VITAMIN D 25 HYDROXY (VIT D DEFICIENCY, FRACTURES): Vit D, 25-Hydroxy: 18.55 ng/mL — ABNORMAL LOW (ref 30–100)

## 2021-07-20 LAB — VITAMIN B12: Vitamin B-12: 304 pg/mL (ref 180–914)

## 2021-07-20 MED ORDER — CHOLECALCIFEROL 10 MCG (400 UNIT) PO TABS
800.0000 [IU] | ORAL_TABLET | Freq: Every day | ORAL | Status: DC
  Administered 2021-07-20 – 2021-07-22 (×3): 800 [IU] via ORAL
  Filled 2021-07-20 (×3): qty 2

## 2021-07-20 MED ORDER — PHENYTOIN SODIUM EXTENDED 100 MG PO CAPS
300.0000 mg | ORAL_CAPSULE | Freq: Every day | ORAL | Status: DC
  Administered 2021-07-20 – 2021-07-21 (×2): 300 mg via ORAL
  Filled 2021-07-20 (×3): qty 3

## 2021-07-20 MED ORDER — CYANOCOBALAMIN 1000 MCG/ML IJ SOLN
1000.0000 ug | Freq: Once | INTRAMUSCULAR | Status: AC
  Administered 2021-07-20: 1000 ug via INTRAMUSCULAR
  Filled 2021-07-20: qty 1
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
## 2021-07-20 NOTE — TOC Initial Note (Signed)
Transition of Care [facility]) - Initial/Assessment Note    Patient Details  Name: Aaron [name] MRN: [id number] Date of Birth: [birth year]  Transition of Care [facility]) CM/SW Contact:    Coralee Pesa, [place] Phone Number: 07/20/2021, 2:40 PM  Clinical Narrative:                 CSW met with pt at bedside to discuss PT recommendations. Pt is agreeable to [facility], and states he has had it before, but does not remember which company. Pt confirms he lives with his son, he asks CSW to follow up with son as he is tired.  CSW attempted x3 to contact son, straight to a full VM. TOC will continue to follow to set pt up with the appropriate follow up provider.  Expected Discharge Plan: [place] Barriers to Discharge: Continued Medical Work up, Other (must enter comment) (Need to confirm details w/ son)   Patient Goals and CMS Choice Patient states their goals for this hospitalization and ongoing recovery are:: Pt wants to get better and return home. CMS Medicare.gov Compare Post Acute Care list provided to:: Patient Choice offered to / list presented to : Patient, Adult Children  Expected Discharge Plan and Services Expected Discharge Plan: [place] Acute Care Choice: [place] arrangements for the past 2 months: Single Family Home                                      Prior Living Arrangements/Services Living arrangements for the past 2 months: Single Family Home Lives with:: Adult Children Patient language and need for interpreter reviewed:: Yes Do you feel safe going back to the place where you live?: Yes      Need for Family Participation in Patient Care: Yes (Comment) Care giver support system in place?: Yes (comment) Current home services: DME Criminal Activity/Legal Involvement Pertinent to Current Situation/Hospitalization: No - Comment as needed  Activities of Daily Living      Permission Sought/Granted Permission  sought to share information with : Family Supports Permission granted to share information with : Yes, Verbal Permission Granted  Share Information with NAME: Aaron [name]     Permission granted to share info w Relationship: Son  Permission granted to share info w Contact Information: [phone]  Emotional Assessment Appearance:: Appears stated age Attitude/Demeanor/Rapport: Engaged Affect (typically observed): Appropriate Orientation: : Oriented to Self, Oriented to Place, Oriented to  Time, Oriented to Situation Alcohol / Substance Use: Not Applicable Psych Involvement: No (comment)  Admission diagnosis:  Compression fracture of T12 vertebra ([place]) [S22.080A] Compression fracture of T12 vertebra, initial encounter ([place]) [S22.080A] Patient Active Problem List   Diagnosis Date Noted   Compression fracture of T12 vertebra ([place]) 07/19/2021   Orthostatic hypotension 10/22/2020   Pressure injury of skin 10/21/2020   Hip fracture, right ([place]) 10/20/2020   ABLA 10/20/2020   Seizure disorder ([place]) 10/20/2020   Vitamin B 12 deficiency 10/20/2020   Hyponatremia 10/20/2020   Hyperlipidemia 10/20/2020   Malnutrition of moderate degree 10/19/2020   Periprosthetic hip fracture, initial encounter 10/17/2020   Normochromic normocytic anemia 10/17/2020   History of seizure 10/17/2020   Essential hypertension 10/17/2020   Closed bent bone fracture of left ulna 05/09/2017   PCP:  Chesley Noon, MD Pharmacy:   [place], [address]  [place] [address] Phone: [phone] Fax: [phone]     Social Determinants of Health (SDOH) Interventions    Readmission Risk Interventions     No data to display

## 2021-07-21 DIAGNOSIS — E559 Vitamin D deficiency, unspecified: Secondary | ICD-10-CM

## 2021-07-21 DIAGNOSIS — M8000XA Age-related osteoporosis with current pathological fracture, unspecified site, initial encounter for fracture: Secondary | ICD-10-CM

## 2021-07-21 DIAGNOSIS — S22080A Wedge compression fracture of T11-T12 vertebra, initial encounter for closed fracture: Secondary | ICD-10-CM | POA: Diagnosis not present

## 2021-07-21 DIAGNOSIS — M810 Age-related osteoporosis without current pathological fracture: Secondary | ICD-10-CM

## 2021-07-21 LAB — CBC WITH DIFFERENTIAL/PLATELET
Abs Immature Granulocytes: 0.01 K/uL (ref 0.00–0.07)
Basophils Absolute: 0 K/uL (ref 0.0–0.1)
Basophils Relative: 0 %
Eosinophils Absolute: 0.1 K/uL (ref 0.0–0.5)
Eosinophils Relative: 2 %
HCT: 35.2 % — ABNORMAL LOW (ref 39.0–52.0)
Hemoglobin: 11.6 g/dL — ABNORMAL LOW (ref 13.0–17.0)
Immature Granulocytes: 0 %
Lymphocytes Relative: 23 %
Lymphs Abs: 1.8 K/uL (ref 0.7–4.0)
MCH: 28.8 pg (ref 26.0–34.0)
MCHC: 33 g/dL (ref 30.0–36.0)
MCV: 87.3 fL (ref 80.0–100.0)
Monocytes Absolute: 0.9 K/uL (ref 0.1–1.0)
Monocytes Relative: 11 %
Neutro Abs: 5.1 K/uL (ref 1.7–7.7)
Neutrophils Relative %: 64 %
Platelets: 305 K/uL (ref 150–400)
RBC: 4.03 MIL/uL — ABNORMAL LOW (ref 4.22–5.81)
RDW: 14.4 % (ref 11.5–15.5)
WBC: 8 K/uL (ref 4.0–10.5)
nRBC: 0 % (ref 0.0–0.2)

## 2021-07-21 LAB — BASIC METABOLIC PANEL WITH GFR
Anion gap: 11 (ref 5–15)
BUN: 10 mg/dL (ref 8–23)
CO2: 23 mmol/L (ref 22–32)
Calcium: 8.5 mg/dL — ABNORMAL LOW (ref 8.9–10.3)
Chloride: 100 mmol/L (ref 98–111)
Creatinine, Ser: 0.56 mg/dL — ABNORMAL LOW (ref 0.61–1.24)
GFR, Estimated: >60 mL/min
Glucose, Bld: 100 mg/dL — ABNORMAL HIGH (ref 70–99)
Potassium: 3.6 mmol/L (ref 3.5–5.1)
Sodium: 134 mmol/L — ABNORMAL LOW (ref 135–145)

## 2021-07-21 MED ORDER — IBUPROFEN 800 MG PO TABS: 800.0000 mg | ORAL_TABLET | Freq: Three times a day (TID) | ORAL | 0 refills | Status: AC | PRN

## 2021-07-21 MED ORDER — ACETAMINOPHEN 325 MG PO TABS: 650.0000 mg | ORAL_TABLET | Freq: Four times a day (QID) | ORAL | 0 refills | Status: AC | PRN

## 2021-07-21 MED ORDER — CHOLECALCIFEROL 10 MCG (400 UNIT) PO TABS
800.0000 [IU] | ORAL_TABLET | Freq: Every day | ORAL | 0 refills | Status: DC
Start: 2021-07-22 — End: 2021-07-22

## 2021-07-21 NOTE — Progress Notes (Signed)
   Subjective: Patient seen and evaluated at bedside.  Initially patient was comfortable with going home.  In the interim, patient expressed concerns with feeling weak.  He is not comfortable with going home today.  He reports minimal back pain otherwise.  Objective:  Vital signs in last 24 hours: Vitals:   07/20/21 1822 07/20/21 2005 07/21/21 0628 07/21/21 0831  BP: 130/62 137/75 (!) 163/85 128/76  Pulse: 87 89 80 82  Resp: 20 19 19 16   Temp: 98 F (36.7 C) 97.8 F (36.6 C) 97.6 F (36.4 C) 97.7 F (36.5 C)  TempSrc: Oral Oral Oral Oral  SpO2: 99% 100% 99% 99%  Weight:      Height:       Physical Exam HENT:     Head: Normocephalic and atraumatic.  Cardiovascular:     Rate and Rhythm: Normal rate.     Heart sounds: Normal heart sounds.  Pulmonary:     Effort: Pulmonary effort is normal.     Breath sounds: Normal air entry.  Abdominal:     General: Bowel sounds are normal.     Palpations: Abdomen is soft.  Skin:    General: Skin is warm and dry.  Neurological:     Mental Status: He is alert. Mental status is at baseline.  Psychiatric:        Behavior: Behavior is cooperative.      Assessment/Plan:  Principal Problem:   Compression fracture of T12 vertebra (HCC) Active Problems:   History of seizure   Vitamin B 12 deficiency   Osteoporosis   Vitamin D deficiency  Acute T12 compression fracture Hx recurrent falls Patient is medically stable.  While working with nurse to transfer from bed to chair.  He was noted to have weakness and concerned he may fall if sent home today.  PT/OT will reevaluate patient in the morning.  Otherwise, patient is normotensive, afebrile, and wearing TLSO brace when ambulating.  He reports minimal pain. -Reevaluate for safe discharge tomorrow -Tylenol 650 every 6 hours as needed -Toradol 15 mg every 8 hours as needed -PT/OT tomorrow -Neurosurgery on board recommendations appreciated  Osteoporosis with multiple fragility  fractures Vitamin D deficiency -Daily vitamin D supplement -Start bisphosphonate therapy outpatient once vitamin D levels are repleted -Follow-up outpatient with DEXA scan  Vitamin B12 deficiency -Daily vitamin B12 supplement  PAD Denies symptoms of claudication during hospitalization. 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  Patient is saturating well on room air and denies any respiratory symptoms. -Follow-up outpatient with PFTs   Constipation, resolved -Senokot 2 tablets at bedtime -MiraLAX 17 g twice daily       Prior to Admission Living Arrangement: Anticipated Discharge Location: Barriers to Discharge: Dispo: Anticipated discharge in approximately 1-2 day(s).   , MD 07/21/2021, 4:33 PM Pager: [phone] After 5pm on weekdays and 1pm on weekends: On Call pager [phone]
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
## 2021-07-21 NOTE — Discharge Instructions (Addendum)
1.  Be sure to wear your back brace when walking around at home and out of the home. 2.  Take Tylenol 65 mg every 6 hours for pain as needed.  Take ibuprofen 800 mg every 8 hours as needed for pain.  Do not take both these medications together at the same time. 3.  Home health physical therapy and Occupational Therapy will work with you at home to get your strength back. 4.  Please follow-up with neurosurgery in 2 weeks. 5.  Please follow-up with your primary care doctor in 4 weeks. 6.  Continue taking the vitamin D supplements daily.
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
## 2021-07-21 NOTE — Progress Notes (Signed)
Mobility Specialist Progress Note:   07/21/21 1425  Mobility  Activity Transferred from chair to bed  Level of Assistance Minimal assist, patient does 75% or more  Assistive Device Front wheel walker  Distance Ambulated (ft) 3 ft  Activity Response Tolerated well  $Mobility charge 1 Mobility   Pt requesting to transfer to bed. Required minA throughout transfer. No c/o pain during session. Left in bed with bed alarm on, all needs met.   Nelta Numbers Acute Rehab Secure Chat or Office Phone: [phone]

## 2021-07-21 NOTE — Progress Notes (Addendum)
Mobility Specialist Progress Note:   07/21/21 1050  Mobility  Activity Ambulated with assistance in room  Level of Assistance Minimal assist, patient does 75% or more  Assistive Device Front wheel walker  Distance Ambulated (ft) 20 ft  Activity Response Tolerated fair  $Mobility charge 1 Mobility   Pt eager for mobility session, presenting with a decrease in short term memory. No back pain before mobility. Pt required minA to stand, and very close guarding with gait d/t extreme fatigue after short distance. Pt states he does not feel comfortable going home as he is at a very high risk of falling and feels too weak. RN notified. Pt back in chair with all needs met, chair alarm on.   Nelta Numbers Acute Rehab Secure Chat or Office Phone: [phone]
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
## 2021-07-21 NOTE — Progress Notes (Addendum)
Subjective: Patient reports back pain is getting better. Got up yesterday and pain was not any worse   Objective: Vital signs in last 24 hours: Temp:  [97.6 F (36.4 C)-98 F (36.7 C)] 97.6 F (36.4 C) (06/15 0628) Pulse Rate:  [80-89] 80 (06/15 0628) Resp:  [16-20] 19 (06/15 0628) BP: (117-163)/(62-85) 163/85 (06/15 0628) SpO2:  [98 %-100 %] 99 % (06/15 0628)  Intake/Output from previous day: 06/14 0701 - 06/15 0700 In: 850 [P.O.:850] Out: 900 [Urine:900] Intake/Output this shift: No intake/output data recorded.  Neurologic: Grossly normal  Lab Results: Lab Results  Component Value Date   WBC 8.0 07/21/2021   HGB 11.6 (L) 07/21/2021   HCT 35.2 (L) 07/21/2021   MCV 87.3 07/21/2021   PLT 305 07/21/2021   No results found for: "INR", "PROTIME" BMET Lab Results  Component Value Date   NA 134 (L) 07/21/2021   K 3.6 07/21/2021   CL 100 07/21/2021   CO2 23 07/21/2021   GLUCOSE 100 (H) 07/21/2021   BUN 10 07/21/2021   CREATININE 0.56 (L) 07/21/2021   CALCIUM 8.5 (L) 07/21/2021    Studies/Results: DG THORACOLUMABAR SPINE  Result Date: 07/20/2021 CLINICAL DATA:  [id number] T12 fracture EXAM: THORACOLUMBAR SPINE 1V COMPARISON:  Correlation is made with CT of the thoracic spine from yesterday and MRI of the lumbar spine from July 18, 2021 FINDINGS: There is moderate scoliosis of the thoracolumbar spine with a levo convexity centered at T10 through L3 there is compression fracture of the T12 vertebral body seen with about 30% decrease in the height. 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 Lumbar fusion changes from L2 through L5. Atheromatous calcifications of the arch of the aorta. IMPRESSION: Acute compression fracture of the T12 with about 30% decrease in the height. 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 In view of the osteopenia/osteoporosis, vertebral bodies are not well delineated in the lateral  projection. Electronically Signed   By: Marjo Bicker M.D.   On: 07/20/2021 11:59   MR BRAIN WO CONTRAST  Result Date: 07/19/2021 CLINICAL DATA:  81 year old male with weakness, head trauma. EXAM: MRI HEAD WITHOUT CONTRAST TECHNIQUE: Multiplanar, multiecho pulse sequences of the brain and surrounding structures were obtained without intravenous contrast. COMPARISON:  Head CT 0822 hours today. FINDINGS: Brain: No restricted diffusion or evidence of acute infarction. 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 Dural repair at the craniectomy site with mild if any meningocele or pseudomeningocele there by MRI (series 6, image 25). Extensive additional white matter T2 and FLAIR hyperintense gliosis in the left hemisphere. Contralateral Patchy and confluent cerebral white matter hyperintensity. There are several chronic microhemorrhages in the parietal lobes, on the left likely postoperative in nature. 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 Small chronic right caudate lacunar infarcts. The brainstem and other deep gray matter nuclei appear spared. Ex vacuo appearing ventricular enlargement. No midline shift, mass effect, evidence of mass lesion, or acute intracranial hemorrhage. Cervicomedullary junction and pituitary are within normal limits. Vascular: Major intracranial vascular flow voids are preserved. Skull and upper cervical spine: Negative visible cervical spine. Visualized bone marrow signal is within normal limits. Sinuses/Orbits: Postoperative changes to both globes, otherwise negative orbits. Paranasal Visualized paranasal sinuses and mastoids are stable and well aerated. Other: Visible internal auditory structures appear normal. IMPRESSION: 1. No acute intracranial abnormality. 2. Chronic postoperative changes in the left hemisphere with underlying  cystic encephalomalacia. Superimposed chronic  small vessel disease, including in the cerebellum Electronically Signed   By: Odessa Fleming M.D.   On: 07/19/2021 09:54   CT HEAD WO CONTRAST ( )  Result Date: 07/19/2021 CLINICAL DATA:  Head trauma, minor (Age >= 65y) EXAM: CT HEAD WITHOUT CONTRAST TECHNIQUE: Contiguous axial images were obtained from the base of the skull through the vertex without intravenous contrast. RADIATION DOSE REDUCTION: This exam was performed according to the departmental dose-optimization program which includes automated exposure control, adjustment of the mA and/or kV according to patient size and/or use of iterative reconstruction technique. COMPARISON:  None Available. FINDINGS: Brain: No evidence of acute large vascular territory infarction, acute hemorrhage, hydrocephalus, extra-axial collection or mass lesion/mass effect. 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 Vascular: No hyperdense vessel identified. Calcific intracranial atherosclerosis. Skull: No acute fracture.  Left parietal craniotomy. Sinuses/Orbits: Inferior left maxillary sinus mucosal thickening. Otherwise, clear sinuses. No acute orbital findings. Other: No mastoid effusions. IMPRESSION: 1. No evidence of acute intracranial abnormality. 2. 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 Electronically Signed   By: Feliberto Harts M.D.   On: 07/19/2021 08:42    Assessment/Plan: S/p T12 compression fracture, continue brace when ambulating out of bed. Upright xrays reviewed, no kyphosis or further collapse when standing. Will follow up in the office in 2 weeks with serial xrays.    LOS: 2 days    Tiana Loft [facility] 07/21/2021, 7:54 AM

## 2021-07-22 ENCOUNTER — Other Ambulatory Visit (HOSPITAL_COMMUNITY): Payer: Self-pay

## 2021-07-22 DIAGNOSIS — S22080A Wedge compression fracture of T11-T12 vertebra, initial encounter for closed fracture: Secondary | ICD-10-CM | POA: Diagnosis not present

## 2021-07-22 DIAGNOSIS — M8000XA Age-related osteoporosis with current pathological fracture, unspecified site, initial encounter for fracture: Secondary | ICD-10-CM | POA: Diagnosis not present

## 2021-07-22 MED ORDER — CHOLECALCIFEROL 10 MCG (400 UNIT) PO TABS
800.0000 [IU] | ORAL_TABLET | Freq: Every day | ORAL | 0 refills | Status: AC
Start: 2021-07-22 — End: 2021-09-20
  Filled 2021-07-22: qty 120, 60d supply, fill #0
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
## 2021-07-22 NOTE — Progress Notes (Signed)
Nsg Discharge Note  Admit Date:  07/18/2021 Discharge date: 07/22/2021   [name] to be D/C'd Home per MD order.  AVS completed.   Removed IV-CDI. Reviewed d/c paperwork with patient and son. Answered all questions. Riki Rusk, mobility specialist, showed son how to manage TSLO brace. I wheeled stable patient and belongings to main entrance where he was picked up by his son. Also sent [facility] with him. Patient/caregiver able to verbalize understanding.  Discharge Medication: Allergies as of 07/22/2021   No Known Allergies      Medication List     STOP taking these medications    docusate sodium 100 MG capsule Commonly known as: COLACE   HYDROcodone-acetaminophen 5-325 MG tablet Commonly known as: NORCO/VICODIN   midodrine 10 MG tablet Commonly known as: PROAMATINE       TAKE these medications    acetaminophen 325 MG tablet Commonly known as: TYLENOL Take 2 tablets (650 mg total) by mouth every 6 (six) hours as needed for mild pain.   aspirin EC 81 MG tablet Take 81 mg by mouth daily. Swallow whole.   atorvastatin 80 MG tablet Commonly known as: LIPITOR Take 80 mg by mouth daily.   cholecalciferol 10 MCG (400 UNIT) Tabs tablet Commonly known as: VITAMIN D3 Take 2 tablets (800 Units total) by mouth daily.   ibuprofen 800 MG tablet Commonly known as: ADVIL Take 1 tablet (800 mg total) by mouth every 8 (eight) hours as needed.   latanoprost 0.005 % ophthalmic solution Commonly known as: XALATAN Place 1 drop into both eyes at bedtime.   lisinopril 10 MG tablet Commonly known as: ZESTRIL Take 10 mg by mouth daily.   pantoprazole 40 MG tablet Commonly known as: PROTONIX Take 40 mg by mouth daily.   phenytoin 100 MG ER capsule Commonly known as: DILANTIN Take 300 mg by mouth at bedtime.   polyethylene glycol 17 g packet Commonly known as: MIRALAX / GLYCOLAX Take 17 g by mouth daily as needed for moderate constipation or mild constipation.   senna 8.6  MG Tabs tablet Commonly known as: SENOKOT Take 1 tablet (8.6 mg total) by mouth 2 (two) times daily.               Durable Medical Equipment  (From admission, onward)           Start     Ordered   07/22/21 1252  For home use only DME 3 n 1  Once        07/22/21 1251            Discharge Assessment: Vitals:   07/22/21 0355 07/22/21 0800  BP: 135/78 127/78  Pulse: 82 78  Resp: 18 18  Temp: 97.6 F (36.4 C) 98.8 F (37.1 C)  SpO2: 98% 97%   Skin clean, dry and intact without evidence of skin break down, no evidence of skin tears noted. IV catheter discontinued intact. Site without signs and symptoms of complications - no redness or edema noted at insertion site, patient denies c/o pain - only slight tenderness at site.  Dressing with slight pressure applied.  D/c Instructions-Education: Discharge instructions given to patient/family with verbalized understanding. D/c education completed with patient/family including follow up instructions, medication list, d/c activities limitations if indicated, with other d/c instructions as indicated by MD - patient able to verbalize understanding, all questions fully answered. Patient instructed to return to ED, call 911, or call MD for any changes in condition.  Patient escorted via WC, and  D/C home via private auto.  Karolee Ohs, RN 07/22/2021 5:27 PM

## 2021-07-22 NOTE — TOC Progression Note (Signed)
Transition of Care [facility]) - Progression Note    Patient Details  Name: Aaron [name] MRN: [id number] Date of Birth: 1940/03/16  [facility] CM/SW Contact  Nadene Rubins Adria Devon, RN Phone Number: 07/22/2021, 3:40 PM  Clinical Narrative:    PT recommendation HHPT. Patient wanting to go home . 3in 1 ordered with [employer] .   Cory with [facility] accepted referral for HHPT/OT.   Spoke to son on phone he is aware and will picj\k his dad up at 5 pm    Expected Discharge Plan: Home w Home Health Services Barriers to Discharge: No Barriers Identified  Expected Discharge Plan and Services Expected Discharge Plan: Home w Home Health Services   Discharge Planning Services: CM Consult Post Acute Care Choice: Home Health Living arrangements for the past 2 months: Single Family Home Expected Discharge Date: 07/22/21               DME Arranged: 3-N-1   Date DME Agency Contacted: 07/22/21 Time DME Agency Contacted: 1304 Representative spoke with at DME Agency: [facility] HH Arranged: PT, OT HH Agency: [facility] Date [facility] Agency Contacted: 07/22/21 Time HH Agency Contacted: 1305 Representative spoke with at [facility] Agency: Kandee Keen   Social Determinants of Health (SDOH) Interventions    Readmission Risk Interventions     No data to display

## 2021-07-22 NOTE — Plan of Care (Signed)
  Problem: Education: Goal: Knowledge of General Education information will improve Description: Including pain rating scale, medication(s)/side effects and non-pharmacologic comfort measures 07/22/2021 1645 by Karolee Ohs, RN Outcome: Adequate for Discharge 07/22/2021 0838 by Karolee Ohs, RN Outcome: Progressing   Problem: Health Behavior/Discharge Planning: Goal: Ability to manage health-related needs will improve Outcome: Adequate for Discharge   Problem: Clinical Measurements: Goal: Ability to maintain clinical measurements within normal limits will improve 07/22/2021 1645 by Karolee Ohs, RN Outcome: Adequate for Discharge 07/22/2021 0838 by Karolee Ohs, RN Outcome: Progressing Goal: Will remain free from infection Outcome: Adequate for Discharge Goal: Diagnostic test results will improve Outcome: Adequate for Discharge Goal: Respiratory complications will improve Outcome: Adequate for Discharge Goal: Cardiovascular complication will be avoided Outcome: Adequate for Discharge   Problem: Activity: Goal: Risk for activity intolerance will decrease Outcome: Adequate for Discharge   Problem: Nutrition: Goal: Adequate nutrition will be maintained Outcome: Adequate for Discharge   Problem: Coping: Goal: Level of anxiety will decrease Outcome: Adequate for Discharge   Problem: Elimination: Goal: Will not experience complications related to bowel motility Outcome: Adequate for Discharge Goal: Will not experience complications related to urinary retention Outcome: Adequate for Discharge   Problem: Pain Managment: Goal: General experience of comfort will improve Outcome: Adequate for Discharge   Problem: Safety: Goal: Ability to remain free from injury will improve Outcome: Adequate for Discharge   Problem: Skin Integrity: Goal: Risk for impaired skin integrity will decrease Outcome: Adequate for Discharge   Problem: Acute Rehab PT Goals(only PT should  resolve) Goal: Patient Will Transfer Sit To/From Stand Outcome: Adequate for Discharge Goal: Pt Will Ambulate Outcome: Adequate for Discharge Goal: Pt Will Go Up/Down Stairs Outcome: Adequate for Discharge   Problem: Acute Rehab OT Goals (only OT should resolve) Goal: Pt. Will Perform Grooming Outcome: Adequate for Discharge Goal: Pt. Will Perform Upper Body Dressing Outcome: Adequate for Discharge Goal: OT Additional ADL Goal #1 Outcome: Adequate for Discharge Goal: OT Additional ADL Goal #2 Outcome: Adequate for Discharge

## 2021-07-22 NOTE — Care Management Important Message (Signed)
Important Message  Patient Details  Name: Aaron [name] MRN: [id number] Date of Birth: 19-Mar-1940   Medicare Important Message Given:  Yes     Dorena Bodo 07/22/2021, 2:57 PM

## 2021-07-22 NOTE — Progress Notes (Signed)
Mobility Specialist Progress Note   07/22/21 1710  Mobility  Activity Stood at bedside  Level of Assistance Minimal assist, patient does 75% or more  Assistive Device Front wheel walker  Distance Ambulated (ft) 4 ft  Activity Response Tolerated well  $Mobility charge 1 Mobility   Assisted in getting pt changed and dressed for d/c. X5 STS in order to get pt's properly clothed. Pt left in d/c' ready to be wheeled down by RN and family member.   Frederico Hamman Mobility Specialist Phone Number [phone]

## 2021-07-22 NOTE — Plan of Care (Signed)
  Problem: Education: Goal: Knowledge of General Education information will improve Description: Including pain rating scale, medication(s)/side effects and non-pharmacologic comfort measures Outcome: Progressing   Problem: Clinical Measurements: Goal: Ability to maintain clinical measurements within normal limits will improve Outcome: Progressing   

## 2021-09-15 DIAGNOSIS — R0781 Pleurodynia: Secondary | ICD-10-CM | POA: Diagnosis not present

## 2021-09-15 DIAGNOSIS — S22080A Wedge compression fracture of T11-T12 vertebra, initial encounter for closed fracture: Secondary | ICD-10-CM | POA: Diagnosis not present

## 2021-09-15 DIAGNOSIS — Z743 Need for continuous supervision: Secondary | ICD-10-CM | POA: Diagnosis not present

## 2021-09-15 DIAGNOSIS — S2242XA Multiple fractures of ribs, left side, initial encounter for closed fracture: Secondary | ICD-10-CM | POA: Diagnosis not present

## 2021-09-15 DIAGNOSIS — S0990XA Unspecified injury of head, initial encounter: Secondary | ICD-10-CM | POA: Diagnosis not present

## 2021-09-15 DIAGNOSIS — W19XXXA Unspecified fall, initial encounter: Secondary | ICD-10-CM | POA: Diagnosis not present

## 2021-09-15 DIAGNOSIS — G9389 Other specified disorders of brain: Secondary | ICD-10-CM | POA: Diagnosis not present

## 2021-09-15 DIAGNOSIS — R531 Weakness: Secondary | ICD-10-CM | POA: Diagnosis not present

## 2021-09-15 DIAGNOSIS — S50312A Abrasion of left elbow, initial encounter: Secondary | ICD-10-CM | POA: Diagnosis not present

## 2021-09-16 DIAGNOSIS — E785 Hyperlipidemia, unspecified: Secondary | ICD-10-CM | POA: Diagnosis not present

## 2021-09-16 DIAGNOSIS — S22089A Unspecified fracture of T11-T12 vertebra, initial encounter for closed fracture: Secondary | ICD-10-CM | POA: Diagnosis not present

## 2021-09-16 DIAGNOSIS — Z7982 Long term (current) use of aspirin: Secondary | ICD-10-CM | POA: Diagnosis not present

## 2021-09-16 DIAGNOSIS — K219 Gastro-esophageal reflux disease without esophagitis: Secondary | ICD-10-CM | POA: Diagnosis not present

## 2021-09-16 DIAGNOSIS — Z7401 Bed confinement status: Secondary | ICD-10-CM | POA: Diagnosis not present

## 2021-09-16 DIAGNOSIS — E875 Hyperkalemia: Secondary | ICD-10-CM | POA: Diagnosis not present

## 2021-09-16 DIAGNOSIS — W19XXXS Unspecified fall, sequela: Secondary | ICD-10-CM | POA: Diagnosis not present

## 2021-09-16 DIAGNOSIS — Z79899 Other long term (current) drug therapy: Secondary | ICD-10-CM | POA: Diagnosis not present

## 2021-09-16 DIAGNOSIS — W19XXXA Unspecified fall, initial encounter: Secondary | ICD-10-CM | POA: Diagnosis not present

## 2021-09-16 DIAGNOSIS — S22080A Wedge compression fracture of T11-T12 vertebra, initial encounter for closed fracture: Secondary | ICD-10-CM | POA: Diagnosis not present

## 2021-09-16 DIAGNOSIS — S2242XS Multiple fractures of ribs, left side, sequela: Secondary | ICD-10-CM | POA: Diagnosis not present

## 2021-09-16 DIAGNOSIS — Z87820 Personal history of traumatic brain injury: Secondary | ICD-10-CM | POA: Diagnosis not present

## 2021-09-16 DIAGNOSIS — R0781 Pleurodynia: Secondary | ICD-10-CM | POA: Diagnosis not present

## 2021-09-16 DIAGNOSIS — S50312A Abrasion of left elbow, initial encounter: Secondary | ICD-10-CM | POA: Diagnosis not present

## 2021-09-16 DIAGNOSIS — Z743 Need for continuous supervision: Secondary | ICD-10-CM | POA: Diagnosis not present

## 2021-09-16 DIAGNOSIS — S0990XA Unspecified injury of head, initial encounter: Secondary | ICD-10-CM | POA: Diagnosis not present

## 2021-09-16 DIAGNOSIS — S22080S Wedge compression fracture of T11-T12 vertebra, sequela: Secondary | ICD-10-CM | POA: Diagnosis not present

## 2021-09-16 DIAGNOSIS — S9780XA Crushing injury of unspecified foot, initial encounter: Secondary | ICD-10-CM | POA: Diagnosis not present

## 2021-09-16 DIAGNOSIS — S2242XA Multiple fractures of ribs, left side, initial encounter for closed fracture: Secondary | ICD-10-CM | POA: Diagnosis not present

## 2021-09-16 DIAGNOSIS — G9389 Other specified disorders of brain: Secondary | ICD-10-CM | POA: Diagnosis not present

## 2021-09-16 DIAGNOSIS — Z66 Do not resuscitate: Secondary | ICD-10-CM | POA: Diagnosis not present

## 2021-09-16 DIAGNOSIS — H4089 Other specified glaucoma: Secondary | ICD-10-CM | POA: Diagnosis not present

## 2021-09-16 DIAGNOSIS — Z87828 Personal history of other (healed) physical injury and trauma: Secondary | ICD-10-CM | POA: Diagnosis not present

## 2021-09-16 DIAGNOSIS — M21371 Foot drop, right foot: Secondary | ICD-10-CM | POA: Diagnosis not present

## 2021-09-16 DIAGNOSIS — Z9181 History of falling: Secondary | ICD-10-CM | POA: Diagnosis not present

## 2021-09-16 DIAGNOSIS — E43 Unspecified severe protein-calorie malnutrition: Secondary | ICD-10-CM | POA: Diagnosis not present

## 2021-09-16 DIAGNOSIS — I10 Essential (primary) hypertension: Secondary | ICD-10-CM | POA: Diagnosis not present

## 2021-09-16 DIAGNOSIS — Z681 Body mass index (BMI) 19 or less, adult: Secondary | ICD-10-CM | POA: Diagnosis not present

## 2021-09-19 DIAGNOSIS — W19XXXA Unspecified fall, initial encounter: Secondary | ICD-10-CM | POA: Diagnosis not present

## 2021-09-19 DIAGNOSIS — E785 Hyperlipidemia, unspecified: Secondary | ICD-10-CM | POA: Diagnosis not present

## 2021-09-19 DIAGNOSIS — Z743 Need for continuous supervision: Secondary | ICD-10-CM | POA: Diagnosis not present

## 2021-09-19 DIAGNOSIS — R296 Repeated falls: Secondary | ICD-10-CM | POA: Diagnosis not present

## 2021-09-19 DIAGNOSIS — S22089A Unspecified fracture of T11-T12 vertebra, initial encounter for closed fracture: Secondary | ICD-10-CM | POA: Diagnosis not present

## 2021-09-19 DIAGNOSIS — N390 Urinary tract infection, site not specified: Secondary | ICD-10-CM | POA: Diagnosis not present

## 2021-09-19 DIAGNOSIS — K219 Gastro-esophageal reflux disease without esophagitis: Secondary | ICD-10-CM | POA: Diagnosis not present

## 2021-09-19 DIAGNOSIS — S2242XS Multiple fractures of ribs, left side, sequela: Secondary | ICD-10-CM | POA: Diagnosis not present

## 2021-09-19 DIAGNOSIS — E43 Unspecified severe protein-calorie malnutrition: Secondary | ICD-10-CM | POA: Diagnosis not present

## 2021-09-19 DIAGNOSIS — S22080S Wedge compression fracture of T11-T12 vertebra, sequela: Secondary | ICD-10-CM | POA: Diagnosis not present

## 2021-09-19 DIAGNOSIS — E46 Unspecified protein-calorie malnutrition: Secondary | ICD-10-CM | POA: Diagnosis not present

## 2021-09-19 DIAGNOSIS — H4089 Other specified glaucoma: Secondary | ICD-10-CM | POA: Diagnosis not present

## 2021-09-19 DIAGNOSIS — W19XXXS Unspecified fall, sequela: Secondary | ICD-10-CM | POA: Diagnosis not present

## 2021-09-19 DIAGNOSIS — E559 Vitamin D deficiency, unspecified: Secondary | ICD-10-CM | POA: Diagnosis not present

## 2021-09-19 DIAGNOSIS — I10 Essential (primary) hypertension: Secondary | ICD-10-CM | POA: Diagnosis not present

## 2021-09-19 DIAGNOSIS — S9780XA Crushing injury of unspecified foot, initial encounter: Secondary | ICD-10-CM | POA: Diagnosis not present

## 2021-09-19 DIAGNOSIS — D649 Anemia, unspecified: Secondary | ICD-10-CM | POA: Diagnosis not present

## 2021-09-19 DIAGNOSIS — S2232XD Fracture of one rib, left side, subsequent encounter for fracture with routine healing: Secondary | ICD-10-CM | POA: Diagnosis not present

## 2021-09-19 DIAGNOSIS — S22000S Wedge compression fracture of unspecified thoracic vertebra, sequela: Secondary | ICD-10-CM | POA: Diagnosis not present

## 2021-09-19 DIAGNOSIS — E871 Hypo-osmolality and hyponatremia: Secondary | ICD-10-CM | POA: Diagnosis not present

## 2021-09-19 DIAGNOSIS — Z7401 Bed confinement status: Secondary | ICD-10-CM | POA: Diagnosis not present

## 2021-09-19 DIAGNOSIS — R569 Unspecified convulsions: Secondary | ICD-10-CM | POA: Diagnosis not present

## 2021-09-19 DIAGNOSIS — S2242XA Multiple fractures of ribs, left side, initial encounter for closed fracture: Secondary | ICD-10-CM | POA: Diagnosis not present

## 2021-09-19 DIAGNOSIS — Z87828 Personal history of other (healed) physical injury and trauma: Secondary | ICD-10-CM | POA: Diagnosis not present

## 2021-09-20 DIAGNOSIS — S2232XD Fracture of one rib, left side, subsequent encounter for fracture with routine healing: Secondary | ICD-10-CM | POA: Diagnosis not present

## 2021-09-20 DIAGNOSIS — I10 Essential (primary) hypertension: Secondary | ICD-10-CM | POA: Diagnosis not present

## 2021-09-20 DIAGNOSIS — S22000S Wedge compression fracture of unspecified thoracic vertebra, sequela: Secondary | ICD-10-CM | POA: Diagnosis not present

## 2021-09-20 DIAGNOSIS — E785 Hyperlipidemia, unspecified: Secondary | ICD-10-CM | POA: Diagnosis not present

## 2021-09-29 DIAGNOSIS — S22000S Wedge compression fracture of unspecified thoracic vertebra, sequela: Secondary | ICD-10-CM | POA: Diagnosis not present

## 2021-09-29 DIAGNOSIS — E785 Hyperlipidemia, unspecified: Secondary | ICD-10-CM | POA: Diagnosis not present

## 2021-09-29 DIAGNOSIS — S2232XD Fracture of one rib, left side, subsequent encounter for fracture with routine healing: Secondary | ICD-10-CM | POA: Diagnosis not present

## 2021-09-29 DIAGNOSIS — I10 Essential (primary) hypertension: Secondary | ICD-10-CM | POA: Diagnosis not present

## 2021-10-05 DIAGNOSIS — S2232XD Fracture of one rib, left side, subsequent encounter for fracture with routine healing: Secondary | ICD-10-CM | POA: Diagnosis not present

## 2021-10-05 DIAGNOSIS — E46 Unspecified protein-calorie malnutrition: Secondary | ICD-10-CM | POA: Diagnosis not present

## 2021-10-05 DIAGNOSIS — I10 Essential (primary) hypertension: Secondary | ICD-10-CM | POA: Diagnosis not present

## 2021-10-05 DIAGNOSIS — S22000S Wedge compression fracture of unspecified thoracic vertebra, sequela: Secondary | ICD-10-CM | POA: Diagnosis not present

## 2021-10-06 DIAGNOSIS — S22000S Wedge compression fracture of unspecified thoracic vertebra, sequela: Secondary | ICD-10-CM | POA: Diagnosis not present

## 2021-10-11 DIAGNOSIS — R569 Unspecified convulsions: Secondary | ICD-10-CM | POA: Diagnosis not present

## 2021-10-11 DIAGNOSIS — E785 Hyperlipidemia, unspecified: Secondary | ICD-10-CM | POA: Diagnosis not present

## 2021-10-11 DIAGNOSIS — I10 Essential (primary) hypertension: Secondary | ICD-10-CM | POA: Diagnosis not present

## 2021-10-20 DIAGNOSIS — E46 Unspecified protein-calorie malnutrition: Secondary | ICD-10-CM | POA: Diagnosis not present

## 2021-10-20 DIAGNOSIS — S22000S Wedge compression fracture of unspecified thoracic vertebra, sequela: Secondary | ICD-10-CM | POA: Diagnosis not present

## 2021-10-20 DIAGNOSIS — I10 Essential (primary) hypertension: Secondary | ICD-10-CM | POA: Diagnosis not present

## 2021-10-20 DIAGNOSIS — E785 Hyperlipidemia, unspecified: Secondary | ICD-10-CM | POA: Diagnosis not present

## 2021-11-03 DIAGNOSIS — S22000S Wedge compression fracture of unspecified thoracic vertebra, sequela: Secondary | ICD-10-CM | POA: Diagnosis not present

## 2021-11-03 DIAGNOSIS — E785 Hyperlipidemia, unspecified: Secondary | ICD-10-CM | POA: Diagnosis not present

## 2021-11-03 DIAGNOSIS — I10 Essential (primary) hypertension: Secondary | ICD-10-CM | POA: Diagnosis not present

## 2021-11-05 DIAGNOSIS — N390 Urinary tract infection, site not specified: Secondary | ICD-10-CM | POA: Diagnosis not present

## 2021-11-15 DIAGNOSIS — R296 Repeated falls: Secondary | ICD-10-CM | POA: Diagnosis not present

## 2021-11-24 DIAGNOSIS — E785 Hyperlipidemia, unspecified: Secondary | ICD-10-CM | POA: Diagnosis not present

## 2021-11-24 DIAGNOSIS — E46 Unspecified protein-calorie malnutrition: Secondary | ICD-10-CM | POA: Diagnosis not present

## 2021-11-24 DIAGNOSIS — I10 Essential (primary) hypertension: Secondary | ICD-10-CM | POA: Diagnosis not present

## 2021-11-24 DIAGNOSIS — E559 Vitamin D deficiency, unspecified: Secondary | ICD-10-CM | POA: Diagnosis not present

## 2021-11-28 DIAGNOSIS — W19XXXA Unspecified fall, initial encounter: Secondary | ICD-10-CM | POA: Diagnosis not present

## 2021-11-28 DIAGNOSIS — R569 Unspecified convulsions: Secondary | ICD-10-CM | POA: Diagnosis not present

## 2021-11-28 DIAGNOSIS — E559 Vitamin D deficiency, unspecified: Secondary | ICD-10-CM | POA: Diagnosis not present

## 2021-11-30 DIAGNOSIS — R569 Unspecified convulsions: Secondary | ICD-10-CM | POA: Diagnosis not present

## 2021-11-30 DIAGNOSIS — E871 Hypo-osmolality and hyponatremia: Secondary | ICD-10-CM | POA: Diagnosis not present

## 2021-11-30 DIAGNOSIS — E559 Vitamin D deficiency, unspecified: Secondary | ICD-10-CM | POA: Diagnosis not present

## 2021-12-20 DIAGNOSIS — E559 Vitamin D deficiency, unspecified: Secondary | ICD-10-CM | POA: Diagnosis not present

## 2021-12-20 DIAGNOSIS — E46 Unspecified protein-calorie malnutrition: Secondary | ICD-10-CM | POA: Diagnosis not present

## 2021-12-20 DIAGNOSIS — R569 Unspecified convulsions: Secondary | ICD-10-CM | POA: Diagnosis not present

## 2021-12-20 DIAGNOSIS — I10 Essential (primary) hypertension: Secondary | ICD-10-CM | POA: Diagnosis not present

## 2021-12-22 DIAGNOSIS — E871 Hypo-osmolality and hyponatremia: Secondary | ICD-10-CM | POA: Diagnosis not present

## 2021-12-22 DIAGNOSIS — E785 Hyperlipidemia, unspecified: Secondary | ICD-10-CM | POA: Diagnosis not present

## 2021-12-27 DIAGNOSIS — I10 Essential (primary) hypertension: Secondary | ICD-10-CM | POA: Diagnosis not present

## 2021-12-27 DIAGNOSIS — E785 Hyperlipidemia, unspecified: Secondary | ICD-10-CM | POA: Diagnosis not present

## 2021-12-27 DIAGNOSIS — E871 Hypo-osmolality and hyponatremia: Secondary | ICD-10-CM | POA: Diagnosis not present

## 2021-12-27 DIAGNOSIS — D649 Anemia, unspecified: Secondary | ICD-10-CM | POA: Diagnosis not present

## 2023-07-12 DIAGNOSIS — M2141 Flat foot [pes planus] (acquired), right foot: Secondary | ICD-10-CM | POA: Diagnosis not present

## 2023-07-12 DIAGNOSIS — M79675 Pain in left toe(s): Secondary | ICD-10-CM | POA: Diagnosis not present

## 2023-07-12 DIAGNOSIS — M2041 Other hammer toe(s) (acquired), right foot: Secondary | ICD-10-CM | POA: Diagnosis not present

## 2023-07-12 DIAGNOSIS — B351 Tinea unguium: Secondary | ICD-10-CM | POA: Diagnosis not present

## 2023-09-25 DIAGNOSIS — S32591A Other specified fracture of right pubis, initial encounter for closed fracture: Secondary | ICD-10-CM | POA: Diagnosis not present

## 2023-09-25 DIAGNOSIS — S0990XA Unspecified injury of head, initial encounter: Secondary | ICD-10-CM | POA: Diagnosis not present

## 2023-09-25 DIAGNOSIS — R0781 Pleurodynia: Secondary | ICD-10-CM | POA: Diagnosis not present

## 2023-09-25 DIAGNOSIS — W19XXXA Unspecified fall, initial encounter: Secondary | ICD-10-CM | POA: Diagnosis not present

## 2023-09-25 DIAGNOSIS — R1111 Vomiting without nausea: Secondary | ICD-10-CM | POA: Diagnosis not present

## 2023-09-25 DIAGNOSIS — Z043 Encounter for examination and observation following other accident: Secondary | ICD-10-CM | POA: Diagnosis not present

## 2023-09-25 DIAGNOSIS — Z96641 Presence of right artificial hip joint: Secondary | ICD-10-CM | POA: Diagnosis not present

## 2023-09-25 DIAGNOSIS — R079 Chest pain, unspecified: Secondary | ICD-10-CM | POA: Diagnosis not present

## 2023-09-25 DIAGNOSIS — S3219XA Other fracture of sacrum, initial encounter for closed fracture: Secondary | ICD-10-CM | POA: Diagnosis not present

## 2023-09-25 DIAGNOSIS — S32511A Fracture of superior rim of right pubis, initial encounter for closed fracture: Secondary | ICD-10-CM | POA: Diagnosis not present

## 2023-09-26 DIAGNOSIS — I21A1 Myocardial infarction type 2: Secondary | ICD-10-CM | POA: Diagnosis not present

## 2023-09-26 DIAGNOSIS — I5022 Chronic systolic (congestive) heart failure: Secondary | ICD-10-CM | POA: Diagnosis not present

## 2023-09-26 DIAGNOSIS — S32511A Fracture of superior rim of right pubis, initial encounter for closed fracture: Secondary | ICD-10-CM | POA: Diagnosis not present

## 2023-09-26 DIAGNOSIS — H409 Unspecified glaucoma: Secondary | ICD-10-CM | POA: Diagnosis not present

## 2023-09-26 DIAGNOSIS — Z7982 Long term (current) use of aspirin: Secondary | ICD-10-CM | POA: Diagnosis not present

## 2023-09-26 DIAGNOSIS — S32599A Other specified fracture of unspecified pubis, initial encounter for closed fracture: Secondary | ICD-10-CM | POA: Diagnosis not present

## 2023-09-26 DIAGNOSIS — J9601 Acute respiratory failure with hypoxia: Secondary | ICD-10-CM | POA: Diagnosis not present

## 2023-09-26 DIAGNOSIS — Z79899 Other long term (current) drug therapy: Secondary | ICD-10-CM | POA: Diagnosis not present

## 2023-09-26 DIAGNOSIS — G9341 Metabolic encephalopathy: Secondary | ICD-10-CM | POA: Diagnosis not present

## 2023-09-26 DIAGNOSIS — S32591A Other specified fracture of right pubis, initial encounter for closed fracture: Secondary | ICD-10-CM | POA: Diagnosis not present

## 2023-09-26 DIAGNOSIS — W19XXXA Unspecified fall, initial encounter: Secondary | ICD-10-CM | POA: Diagnosis not present

## 2023-09-26 DIAGNOSIS — Z743 Need for continuous supervision: Secondary | ICD-10-CM | POA: Diagnosis not present

## 2023-09-26 DIAGNOSIS — R531 Weakness: Secondary | ICD-10-CM | POA: Diagnosis not present

## 2023-09-26 DIAGNOSIS — Z043 Encounter for examination and observation following other accident: Secondary | ICD-10-CM | POA: Diagnosis not present

## 2023-09-26 DIAGNOSIS — S3219XA Other fracture of sacrum, initial encounter for closed fracture: Secondary | ICD-10-CM | POA: Diagnosis not present

## 2023-09-26 DIAGNOSIS — Z7902 Long term (current) use of antithrombotics/antiplatelets: Secondary | ICD-10-CM | POA: Diagnosis not present

## 2023-09-26 DIAGNOSIS — Z792 Long term (current) use of antibiotics: Secondary | ICD-10-CM | POA: Diagnosis not present

## 2023-09-26 DIAGNOSIS — Z96641 Presence of right artificial hip joint: Secondary | ICD-10-CM | POA: Diagnosis not present

## 2023-09-26 DIAGNOSIS — I110 Hypertensive heart disease with heart failure: Secondary | ICD-10-CM | POA: Diagnosis not present

## 2023-09-26 DIAGNOSIS — R2689 Other abnormalities of gait and mobility: Secondary | ICD-10-CM | POA: Diagnosis not present

## 2023-09-26 DIAGNOSIS — S0990XA Unspecified injury of head, initial encounter: Secondary | ICD-10-CM | POA: Diagnosis not present

## 2023-09-26 DIAGNOSIS — J690 Pneumonitis due to inhalation of food and vomit: Secondary | ICD-10-CM | POA: Diagnosis not present

## 2023-09-26 DIAGNOSIS — Z7952 Long term (current) use of systemic steroids: Secondary | ICD-10-CM | POA: Diagnosis not present

## 2023-09-26 DIAGNOSIS — D62 Acute posthemorrhagic anemia: Secondary | ICD-10-CM | POA: Diagnosis not present

## 2023-09-26 DIAGNOSIS — M25521 Pain in right elbow: Secondary | ICD-10-CM | POA: Diagnosis not present

## 2023-09-26 DIAGNOSIS — M25522 Pain in left elbow: Secondary | ICD-10-CM | POA: Diagnosis not present

## 2023-09-29 DIAGNOSIS — S2242XS Multiple fractures of ribs, left side, sequela: Secondary | ICD-10-CM | POA: Diagnosis not present

## 2023-09-29 DIAGNOSIS — R569 Unspecified convulsions: Secondary | ICD-10-CM | POA: Diagnosis not present

## 2023-09-29 DIAGNOSIS — I25119 Atherosclerotic heart disease of native coronary artery with unspecified angina pectoris: Secondary | ICD-10-CM | POA: Diagnosis not present

## 2023-09-29 DIAGNOSIS — W19XXXS Unspecified fall, sequela: Secondary | ICD-10-CM | POA: Diagnosis not present

## 2023-09-29 DIAGNOSIS — M6259 Muscle wasting and atrophy, not elsewhere classified, multiple sites: Secondary | ICD-10-CM | POA: Diagnosis not present

## 2023-09-29 DIAGNOSIS — T148XXA Other injury of unspecified body region, initial encounter: Secondary | ICD-10-CM | POA: Diagnosis not present

## 2023-09-29 DIAGNOSIS — S329XXD Fracture of unspecified parts of lumbosacral spine and pelvis, subsequent encounter for fracture with routine healing: Secondary | ICD-10-CM | POA: Diagnosis not present

## 2023-09-29 DIAGNOSIS — S329XXA Fracture of unspecified parts of lumbosacral spine and pelvis, initial encounter for closed fracture: Secondary | ICD-10-CM | POA: Diagnosis not present

## 2023-09-29 DIAGNOSIS — Z6822 Body mass index (BMI) 22.0-22.9, adult: Secondary | ICD-10-CM | POA: Diagnosis not present

## 2023-09-29 DIAGNOSIS — Z87820 Personal history of traumatic brain injury: Secondary | ICD-10-CM | POA: Diagnosis not present

## 2023-09-29 DIAGNOSIS — I2510 Atherosclerotic heart disease of native coronary artery without angina pectoris: Secondary | ICD-10-CM | POA: Diagnosis not present

## 2023-09-29 DIAGNOSIS — R197 Diarrhea, unspecified: Secondary | ICD-10-CM | POA: Diagnosis not present

## 2023-09-29 DIAGNOSIS — I5020 Unspecified systolic (congestive) heart failure: Secondary | ICD-10-CM | POA: Diagnosis not present

## 2023-09-29 DIAGNOSIS — S32511A Fracture of superior rim of right pubis, initial encounter for closed fracture: Secondary | ICD-10-CM | POA: Diagnosis not present

## 2023-09-29 DIAGNOSIS — D692 Other nonthrombocytopenic purpura: Secondary | ICD-10-CM | POA: Diagnosis not present

## 2023-09-29 DIAGNOSIS — I21A1 Myocardial infarction type 2: Secondary | ICD-10-CM | POA: Diagnosis not present

## 2023-09-29 DIAGNOSIS — S3210XA Unspecified fracture of sacrum, initial encounter for closed fracture: Secondary | ICD-10-CM | POA: Diagnosis not present

## 2023-09-29 DIAGNOSIS — S3289XA Fracture of other parts of pelvis, initial encounter for closed fracture: Secondary | ICD-10-CM | POA: Diagnosis not present

## 2023-09-29 DIAGNOSIS — H409 Unspecified glaucoma: Secondary | ICD-10-CM | POA: Diagnosis not present

## 2023-09-29 DIAGNOSIS — W19XXXA Unspecified fall, initial encounter: Secondary | ICD-10-CM | POA: Diagnosis not present

## 2023-09-29 DIAGNOSIS — Z743 Need for continuous supervision: Secondary | ICD-10-CM | POA: Diagnosis not present

## 2023-09-29 DIAGNOSIS — G9341 Metabolic encephalopathy: Secondary | ICD-10-CM | POA: Diagnosis not present

## 2023-09-29 DIAGNOSIS — R1311 Dysphagia, oral phase: Secondary | ICD-10-CM | POA: Diagnosis not present

## 2023-09-30 DIAGNOSIS — G9341 Metabolic encephalopathy: Secondary | ICD-10-CM | POA: Diagnosis not present

## 2023-09-30 DIAGNOSIS — I21A1 Myocardial infarction type 2: Secondary | ICD-10-CM | POA: Diagnosis not present

## 2023-09-30 DIAGNOSIS — W19XXXA Unspecified fall, initial encounter: Secondary | ICD-10-CM | POA: Diagnosis not present

## 2023-09-30 DIAGNOSIS — S32511A Fracture of superior rim of right pubis, initial encounter for closed fracture: Secondary | ICD-10-CM | POA: Diagnosis not present

## 2023-10-01 DIAGNOSIS — S3210XA Unspecified fracture of sacrum, initial encounter for closed fracture: Secondary | ICD-10-CM | POA: Diagnosis not present

## 2023-10-01 DIAGNOSIS — S329XXA Fracture of unspecified parts of lumbosacral spine and pelvis, initial encounter for closed fracture: Secondary | ICD-10-CM | POA: Diagnosis not present

## 2023-10-01 DIAGNOSIS — R262 Difficulty in walking, not elsewhere classified: Secondary | ICD-10-CM | POA: Diagnosis not present

## 2023-10-03 DIAGNOSIS — S3210XA Unspecified fracture of sacrum, initial encounter for closed fracture: Secondary | ICD-10-CM | POA: Diagnosis not present

## 2023-10-03 DIAGNOSIS — S329XXA Fracture of unspecified parts of lumbosacral spine and pelvis, initial encounter for closed fracture: Secondary | ICD-10-CM | POA: Diagnosis not present

## 2023-10-03 DIAGNOSIS — R262 Difficulty in walking, not elsewhere classified: Secondary | ICD-10-CM | POA: Diagnosis not present

## 2023-10-23 DIAGNOSIS — S329XXA Fracture of unspecified parts of lumbosacral spine and pelvis, initial encounter for closed fracture: Secondary | ICD-10-CM | POA: Diagnosis not present

## 2023-10-23 DIAGNOSIS — R262 Difficulty in walking, not elsewhere classified: Secondary | ICD-10-CM | POA: Diagnosis not present

## 2023-10-23 DIAGNOSIS — S3210XA Unspecified fracture of sacrum, initial encounter for closed fracture: Secondary | ICD-10-CM | POA: Diagnosis not present

## 2023-11-01 DIAGNOSIS — S329XXA Fracture of unspecified parts of lumbosacral spine and pelvis, initial encounter for closed fracture: Secondary | ICD-10-CM | POA: Diagnosis not present

## 2023-11-01 DIAGNOSIS — R197 Diarrhea, unspecified: Secondary | ICD-10-CM | POA: Diagnosis not present

## 2023-11-01 DIAGNOSIS — I5020 Unspecified systolic (congestive) heart failure: Secondary | ICD-10-CM | POA: Diagnosis not present

## 2023-11-16 ENCOUNTER — Other Ambulatory Visit: Payer: Self-pay

## 2023-11-18 DIAGNOSIS — I493 Ventricular premature depolarization: Secondary | ICD-10-CM | POA: Diagnosis not present

## 2023-11-18 DIAGNOSIS — I4581 Long QT syndrome: Secondary | ICD-10-CM | POA: Diagnosis not present

## 2023-11-18 DIAGNOSIS — I4510 Unspecified right bundle-branch block: Secondary | ICD-10-CM | POA: Diagnosis not present

## 2023-12-08 DEATH — deceased
